# Patient Record
Sex: Female | Born: 1975 | Race: White | Hispanic: No | Marital: Married | Smoking: Former smoker
Health system: Southern US, Community
[De-identification: ages and names within clinical notes are randomized; demographics above are authoritative.]

## PROBLEM LIST (undated history)

## (undated) HISTORY — PX: AUGMENTATION MAMMAPLASTY: SUR837

## (undated) HISTORY — PX: WISDOM TOOTH EXTRACTION: SHX21

---

## 2006-11-05 HISTORY — PX: BREAST ENHANCEMENT SURGERY: SHX7

## 2012-12-23 ENCOUNTER — Ambulatory Visit: Payer: Self-pay | Admitting: Family Medicine

## 2012-12-23 VITALS — BP 112/62 | HR 74 | Temp 98.6°F | Resp 18 | Ht 65.0 in | Wt 124.8 lb

## 2012-12-23 DIAGNOSIS — R319 Hematuria, unspecified: Secondary | ICD-10-CM

## 2012-12-23 DIAGNOSIS — N390 Urinary tract infection, site not specified: Secondary | ICD-10-CM

## 2012-12-23 LAB — POCT UA - MICROSCOPIC ONLY
Bacteria, U Microscopic: NEGATIVE
Casts, Ur, LPF, POC: NEGATIVE
Crystals, Ur, HPF, POC: NEGATIVE
Mucus, UA: NEGATIVE
Yeast, UA: NEGATIVE

## 2012-12-23 LAB — POCT URINALYSIS DIPSTICK
Protein, UA: 100
Spec Grav, UA: 1.02
Urobilinogen, UA: 0.2
pH, UA: 7

## 2012-12-23 MED ORDER — PHENAZOPYRIDINE HCL 200 MG PO TABS: 200.0000 mg | ORAL_TABLET | Freq: Three times a day (TID) | ORAL | Status: DC | PRN

## 2012-12-23 MED ORDER — NITROFURANTOIN MONOHYD MACRO 100 MG PO CAPS: 100.0000 mg | ORAL_CAPSULE | Freq: Two times a day (BID) | ORAL | Status: DC

## 2012-12-23 NOTE — Progress Notes (Signed)
Subjective:    Patient ID: Amber Hodge, female    DOB: February 06, 1976, 37 y.o.   MRN: [id number] Chief Complaint  Patient presents with  . Urinary Tract Infection    pain when urinating, nausea, urge to urinate a lot   HPI  Blood in urine started this morning. Has a h/o UTIs since 37 yo - takes daily cranberry extract.  Hasn't had an infection in at last 3 yrs - has been able to control through cranberry.  Pain over bladder and pressure was severe this morning - almost brought her to tears.  Throughout the day it progressed to drops of blood on t.p. when wiping.  All sxs just started this morning - really escalated over an hr. No back pain.  No f/c, no vomiting but has been nauseated, no c/d.    Due to start period tomorrow.  Is sexually active using condoms.   History reviewed. No pertinent past medical history. No current outpatient prescriptions on file prior to visit.   No current facility-administered medications on file prior to visit.   Allergies  Allergen Reactions  . Codeine Itching and Swelling     Review of Systems  Constitutional: Negative for fever, chills, diaphoresis, activity change, appetite change, fatigue and unexpected weight change.  Gastrointestinal: Positive for nausea. Negative for vomiting, abdominal pain, diarrhea, constipation, blood in stool, anal bleeding and rectal pain.  Genitourinary: Positive for dysuria, urgency, frequency and pelvic pain. Negative for hematuria, flank pain, decreased urine volume, vaginal bleeding, vaginal discharge, enuresis, difficulty urinating, genital sores, vaginal pain, menstrual problem and dyspareunia.  Musculoskeletal: Negative for gait problem.  Skin: Negative for rash.  Hematological: Negative for adenopathy.  Psychiatric/Behavioral: The patient is not nervous/anxious.       BP 112/62  Pulse 74  Temp(Src) 98.6 F (37 C) (Oral)  Resp 18  Ht 5\' 5"  (1.651 m)  Wt 124 lb 12.8 oz (56.609 kg)  BMI 20.77 kg/m2  SpO2 100%   LMP 11/26/2012 Objective:   Physical Exam  Constitutional: She is oriented to person, place, and time. She appears well-developed and well-nourished. No distress.  HENT:  Head: Normocephalic and atraumatic.  Right Ear: External ear normal.  Left Ear: External ear normal.  Eyes: Conjunctivae are normal. No scleral icterus.  Neck: Normal range of motion. Neck supple. No thyromegaly present.  Cardiovascular: Normal rate, regular rhythm, normal heart sounds and intact distal pulses.   Pulmonary/Chest: Effort normal and breath sounds normal. No respiratory distress.  Abdominal: Soft. Bowel sounds are normal. She exhibits no distension and no mass. There is no hepatosplenomegaly. There is tenderness in the suprapubic area. There is no rigidity, no rebound, no guarding and no CVA tenderness.  Musculoskeletal: She exhibits no edema.  Lymphadenopathy:    She has no cervical adenopathy.  Neurological: She is alert and oriented to person, place, and time.  Skin: Skin is warm and dry. She is not diaphoretic. No erythema.  Psychiatric: She has a normal mood and affect. Her behavior is normal.          Results for orders placed in visit on 12/23/12  POCT UA - MICROSCOPIC ONLY      Result Value Range   WBC, Ur, HPF, POC TNTC     RBC, urine, microscopic TNTC     Bacteria, U Microscopic NEG     Mucus, UA NEG     Epithelial cells, urine per micros 3-4     Crystals, Ur, HPF, POC NEG  Casts, Ur, LPF, POC NEG     Yeast, UA NEG     Renal tubular cells POSITIVE    POCT URINALYSIS DIPSTICK      Result Value Range   Color, UA BROWN     Clarity, UA CLOUDY     Glucose, UA NEG     Bilirubin, UA NEG     Ketones, UA NEG     Spec Grav, UA 1.020     Blood, UA LARGE     pH, UA 7.0     Protein, UA 100     Urobilinogen, UA 0.2     Nitrite, UA POSITIVE     Leukocytes, UA large (3+)     Assessment & Plan:   Hematuria - Plan: POCT UA - Microscopic Only, POCT urinalysis dipstick, CANCELED: Urine  culture  UTI (urinary tract infection) - not sent for clx due to no insurance.  Meds ordered this encounter  Medications  . CRANBERRY EXTRACT PO    Sig: Take 100 mg by mouth.  . nitrofurantoin, macrocrystal-monohydrate, (MACROBID) 100 MG capsule    Sig: Take 1 capsule (100 mg total) by mouth 2 (two) times daily.    Dispense:  14 capsule    Refill:  0  . phenazopyridine (PYRIDIUM) 200 MG tablet    Sig: Take 1 tablet (200 mg total) by mouth 3 (three) times daily as needed for pain.    Dispense:  10 tablet    Refill:  0   Norberto Sorenson, MD MPH

## 2012-12-23 NOTE — Patient Instructions (Signed)
Urinary Tract Infection  Urinary tract infections (UTIs) can develop anywhere along your urinary tract. Your urinary tract is your body's drainage system for removing wastes and extra water. Your urinary tract includes two kidneys, two ureters, a bladder, and a urethra. Your kidneys are a pair of bean-shaped organs. Each kidney is about the size of your fist. They are located below your ribs, one on each side of your spine.  CAUSES  Infections are caused by microbes, which are microscopic organisms, including fungi, viruses, and bacteria. These organisms are so small that they can only be seen through a microscope. Bacteria are the microbes that most commonly cause UTIs.  SYMPTOMS   Symptoms of UTIs may vary by age and gender of the patient and by the location of the infection. Symptoms in young women typically include a frequent and intense urge to urinate and a painful, burning feeling in the bladder or urethra during urination. Older women and men are more likely to be tired, shaky, and weak and have muscle aches and abdominal pain. A fever may mean the infection is in your kidneys. Other symptoms of a kidney infection include pain in your back or sides below the ribs, nausea, and vomiting.  DIAGNOSIS  To diagnose a UTI, your caregiver will ask you about your symptoms. Your caregiver also will ask to provide a urine sample. The urine sample will be tested for bacteria and white blood cells. White blood cells are made by your body to help fight infection.  TREATMENT   Typically, UTIs can be treated with medication. Because most UTIs are caused by a bacterial infection, they usually can be treated with the use of antibiotics. The choice of antibiotic and length of treatment depend on your symptoms and the type of bacteria causing your infection.  HOME CARE INSTRUCTIONS   If you were prescribed antibiotics, take them exactly as your caregiver instructs you. Finish the medication even if you feel better after you  have only taken some of the medication.   Drink enough water and fluids to keep your urine clear or pale yellow.   Avoid caffeine, tea, and carbonated beverages. They tend to irritate your bladder.   Empty your bladder often. Avoid holding urine for long periods of time.   Empty your bladder before and after sexual intercourse.   After a bowel movement, women should cleanse from front to back. Use each tissue only once.  SEEK MEDICAL CARE IF:    You have back pain.   You develop a fever.   Your symptoms do not begin to resolve within 3 days.  SEEK IMMEDIATE MEDICAL CARE IF:    You have severe back pain or lower abdominal pain.   You develop chills.   You have nausea or vomiting.   You have continued burning or discomfort with urination.  MAKE SURE YOU:    Understand these instructions.   Will watch your condition.   Will get help right away if you are not doing well or get worse.  Document Released: 11/30/2004 Document Revised: 08/22/2011 Document Reviewed: 03/31/2011  [employer] Patient Information 2014 [employer].

## 2014-02-18 ENCOUNTER — Other Ambulatory Visit: Payer: Self-pay | Admitting: Obstetrics and Gynecology

## 2014-02-18 ENCOUNTER — Other Ambulatory Visit (HOSPITAL_COMMUNITY)
Admission: RE | Admit: 2014-02-18 | Discharge: 2014-02-18 | Disposition: A | Discharge status: Home / Self Care | Payer: 59 | Source: Ambulatory Visit | Attending: Obstetrics and Gynecology | Admitting: Obstetrics and Gynecology

## 2014-02-18 DIAGNOSIS — Z1151 Encounter for screening for human papillomavirus (HPV): Secondary | ICD-10-CM | POA: Insufficient documentation

## 2014-02-18 DIAGNOSIS — Z01419 Encounter for gynecological examination (general) (routine) without abnormal findings: Secondary | ICD-10-CM | POA: Diagnosis present

## 2014-02-19 LAB — CYTOLOGY - PAP

## 2015-04-22 ENCOUNTER — Other Ambulatory Visit (HOSPITAL_COMMUNITY)
Admission: RE | Admit: 2015-04-22 | Discharge: 2015-04-22 | Disposition: A | Discharge status: Home / Self Care | Payer: 59 | Source: Ambulatory Visit | Attending: Obstetrics and Gynecology | Admitting: Obstetrics and Gynecology

## 2015-04-22 ENCOUNTER — Other Ambulatory Visit: Payer: Self-pay | Admitting: Obstetrics and Gynecology

## 2015-04-22 DIAGNOSIS — Z01411 Encounter for gynecological examination (general) (routine) with abnormal findings: Secondary | ICD-10-CM | POA: Insufficient documentation

## 2015-04-22 DIAGNOSIS — Z1151 Encounter for screening for human papillomavirus (HPV): Secondary | ICD-10-CM | POA: Insufficient documentation

## 2015-04-23 LAB — CYTOLOGY - PAP

## 2015-05-07 ENCOUNTER — Other Ambulatory Visit: Payer: Self-pay

## 2015-05-07 DIAGNOSIS — Z1231 Encounter for screening mammogram for malignant neoplasm of breast: Secondary | ICD-10-CM

## 2015-05-07 DIAGNOSIS — Z9882 Breast implant status: Secondary | ICD-10-CM

## 2015-05-11 ENCOUNTER — Other Ambulatory Visit: Payer: Self-pay | Admitting: Obstetrics and Gynecology

## 2015-05-28 ENCOUNTER — Ambulatory Visit
Admission: RE | Admit: 2015-05-28 | Discharge: 2015-05-28 | Disposition: A | Discharge status: Home / Self Care | Payer: 59 | Source: Ambulatory Visit

## 2015-05-28 DIAGNOSIS — Z9882 Breast implant status: Secondary | ICD-10-CM

## 2015-05-28 DIAGNOSIS — Z1231 Encounter for screening mammogram for malignant neoplasm of breast: Secondary | ICD-10-CM

## 2016-01-10 ENCOUNTER — Ambulatory Visit (INDEPENDENT_AMBULATORY_CARE_PROVIDER_SITE_OTHER): Payer: 59 | Admitting: Physician Assistant

## 2016-01-10 VITALS — BP 90/70 | HR 75 | Temp 98.3°F | Resp 17 | Ht 65.0 in | Wt 130.0 lb

## 2016-01-10 DIAGNOSIS — J029 Acute pharyngitis, unspecified: Secondary | ICD-10-CM | POA: Diagnosis not present

## 2016-01-10 DIAGNOSIS — J0100 Acute maxillary sinusitis, unspecified: Secondary | ICD-10-CM | POA: Diagnosis not present

## 2016-01-10 LAB — POCT RAPID STREP A (OFFICE): RAPID STREP A SCREEN: NEGATIVE

## 2016-01-10 MED ORDER — GUAIFENESIN ER 1200 MG PO TB12: 1.0000 | ORAL_TABLET | Freq: Two times a day (BID) | ORAL | 1 refills | Status: DC | PRN

## 2016-01-10 MED ORDER — AMOXICILLIN-POT CLAVULANATE 875-125 MG PO TABS: 1.0000 | ORAL_TABLET | Freq: Two times a day (BID) | ORAL | 0 refills | Status: DC

## 2016-01-10 MED ORDER — AZELASTINE HCL 0.15 % NA SOLN: 2.0000 | Freq: Two times a day (BID) | NASAL | 0 refills | Status: DC

## 2016-01-10 NOTE — Progress Notes (Signed)
Subjective:    Patient ID: Amber Hodge, female    DOB: [birth year], 40 y.o.   MRN: [id number]  HPI: Presents with sore throat which began 6 days ago. Associated symptoms include nasal congestion and ear pain. States she feels like it "moved to my nasal passages yesterday" and that it has been worsening since then. Additional symptoms include congestion, lethargy, sinus pressure, mild cough without sputum, chills, headache, puffy eyes, decreased appetite, night sweats last night, rhinorrhea, and sneezing. 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 She denies fevers, N/V, abdominal pain, wheezing, SOB, eye discharge/itching/redness, ear discharge, tinnitus, chest pain or tightness. Has not received the flu vaccine. Has been using Ibuprofen (for headache), Mucinex DM, and neti pot at home which have provided some relief.  Review of Systems  Constitutional: Positive for appetite change, chills, diaphoresis and fatigue. Negative for fever and unexpected weight change.  HENT: Positive for congestion, postnasal drip, rhinorrhea, sinus pain, sinus pressure, sneezing and sore throat. Negative for ear discharge, ear pain and tinnitus.   Eyes: Negative for pain, discharge, redness and itching.  Respiratory: Positive for cough. Negative for choking, chest tightness, shortness of breath and wheezing.   Gastrointestinal: Negative for abdominal pain, blood in stool, constipation, diarrhea, nausea and vomiting.  Genitourinary: Negative for difficulty urinating, dysuria, frequency, hematuria and urgency.  Musculoskeletal: Negative for arthralgias and myalgias.  Skin: Negative for rash.  Neurological: Positive for headaches. Negative for dizziness, tremors, weakness and light-headedness.  Psychiatric/Behavioral: Negative for dysphoric mood.   Allergies  Allergen Reactions  . Codeine Itching and Swelling   Prior to Admission  medications   Medication Sig Start Date End Date Taking? Authorizing Provider  CRANBERRY EXTRACT PO Take 100 mg by mouth.   Yes Historical Provider, MD  Azelastine HCl 0.15 % SOLN Place 2 sprays into both nostrils 2 (two) times daily. 01/10/16   Chelle Jeffery, PA-C  Guaifenesin (MUCINEX MAXIMUM STRENGTH) 1200 MG TB12 Take 1 tablet (1,200 mg total) by mouth every 12 (twelve) hours as needed. 01/10/16   Chelle Jeffery, PA-C   There are no active problems to display for this patient.     Objective:   Physical Exam  Constitutional: She is oriented to person, place, and time. She appears well-developed and well-nourished.  HENT:  Head: Normocephalic and atraumatic. Head is without right periorbital erythema and without left periorbital erythema.  Right Ear: External ear normal. No drainage, swelling or tenderness. Tympanic membrane is not injected, not scarred, not perforated, not erythematous, not retracted and not bulging. No middle ear effusion.  Left Ear: External ear normal. No drainage, swelling or tenderness. Tympanic membrane is not injected, not scarred, not perforated, not erythematous, not retracted and not bulging.  No middle ear effusion.  Nose: Rhinorrhea present. No mucosal edema, sinus tenderness, septal deviation or nasal septal hematoma. No epistaxis. Right sinus exhibits maxillary sinus tenderness and frontal sinus tenderness. Left sinus exhibits maxillary sinus tenderness and frontal sinus tenderness.  Mouth/Throat: Uvula is midline. Mucous membranes are not pale, not dry and not cyanotic. No oral lesions. Normal dentition. Posterior oropharyngeal erythema present. No oropharyngeal exudate, posterior oropharyngeal edema or tonsillar abscesses.  Eyes: Conjunctivae are normal. Pupils are equal, round, and reactive to light. Right eye exhibits no discharge and no exudate. Left eye exhibits no discharge and no exudate. Right conjunctiva is not injected. Left conjunctiva is not injected.  No scleral icterus. Right pupil  is round and reactive. Left pupil is round and reactive. Pupils are equal.  Neck: Normal range of motion. Neck supple. No tracheal deviation present. No thyromegaly present.  Cardiovascular: Normal rate, regular rhythm, S1 normal, S2 normal, normal heart sounds and intact distal pulses.  Exam reveals no gallop and no friction rub.   No murmur heard. Pulmonary/Chest: Effort normal and breath sounds normal. No stridor. No respiratory distress. She has no decreased breath sounds. She has no wheezes. She has no rhonchi. She has no rales.  Lymphadenopathy:    She has no cervical adenopathy.  Neurological: She is alert and oriented to person, place, and time.  Skin: Skin is warm and dry. No rash noted. No erythema.  Psychiatric: She has a normal mood and affect. Her behavior is normal.     Results for orders placed or performed in visit on 01/10/16  POCT rapid strep A  Result Value Ref Range   Rapid Strep A Screen Negative Negative       Assessment & Plan:  1. Sore throat Negative rapid strep test, throat culture pending. Recommended rest, increased fluids, Azelastine nasal spray, and Mucinex DM for relief. Instructed patient to RTC if symptoms not improving or worsening. - Azelastine HCl 0.15 % SOLN; Place 2 sprays into both nostrils 2 (two) times daily.  Dispense: 30 mL; Refill: 0 - Guaifenesin (MUCINEX MAXIMUM STRENGTH) 1200 MG TB12; Take 1 tablet (1,200 mg total) by mouth every 12 (twelve) hours as needed.  Dispense: 14 tablet; Refill: 1 - POCT rapid strep A - Culture, Group A Strep

## 2016-01-10 NOTE — Progress Notes (Signed)
Patient ID: Amber Hodge, female     DOB: June 27, 1975, 40 y.o.    MRN: [id number]  PCP: No PCP Per Patient  Chief Complaint  Patient presents with  . Nasal Congestion    Onset 6 days  . Sore Throat  . Ear Pain    Subjective:    HPI  Presents for evaluation of 6 days of sore throat, associated with nasal congestion and ear pain, worsening since yesterday.  She relates fatigue, chills, headache, anorexia. 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 Ibuprofen, OTC guaifenesin and neti pot have helped some. No fever, nausea, vomiting, diarrhea, SOB, CP, dizziness. Has not yet received the seasonal flu vaccine.  Prior to Admission medications   Medication Sig Start Date End Date Taking? Authorizing Provider  CRANBERRY EXTRACT PO Take 100 mg by mouth.   Yes Historical Provider, MD     Allergies  Allergen Reactions  . Codeine Itching and Swelling     There are no active problems to display for this patient.    Family History  Problem Relation Age of Onset  . Hyperlipidemia Paternal Grandfather   . Diabetes Paternal Grandfather      Social History   Social History  . Marital status: Divorced    Spouse name: N/A  . Number of children: N/A  . Years of education: N/A   Occupational History  . Not on file.   Social History Main Topics  . Smoking status: Former Smoker    Types: Cigarettes    Quit date: 03/11/2000  . Smokeless tobacco: Never Used     Comment: Socially, while drinking   . Alcohol use 2.0 oz/week    4 Standard drinks or equivalent per week     Comment: Socially   . Drug use: No  . Sexual activity: Yes   Other Topics Concern  . Not on file   Social History Narrative   Works as a Interior and spatial designerhairdresser         Review of Systems Constitutional: Positive for appetite change, chills, diaphoresis and fatigue. Negative for fever and unexpected weight  change.  HENT: Positive for congestion, postnasal drip, rhinorrhea, sinus pain, sinus pressure, sneezing and sore throat. Negative for ear discharge, ear pain and tinnitus.   Eyes: Negative for pain, discharge, redness and itching.  Respiratory: Positive for cough. Negative for choking, chest tightness, shortness of breath and wheezing.   Gastrointestinal: Negative for abdominal pain, blood in stool, constipation, diarrhea, nausea and vomiting.  Genitourinary: Negative for difficulty urinating, dysuria, frequency, hematuria and urgency.  Musculoskeletal: Negative for arthralgias and myalgias.  Skin: Negative for rash.  Neurological: Positive for headaches. Negative for dizziness, tremors, weakness and light-headedness.  Psychiatric/Behavioral: Negative for dysphoric mood.       Objective:  Physical Exam  Constitutional: She is oriented to person, place, and time. She appears well-developed and well-nourished. No distress.  BP 90/70 (BP Location: Right Arm, Patient Position: Sitting, Cuff Size: Normal)   Pulse 75   Temp 98.3 F (36.8 C) (Oral)   Resp 17   Ht 5\' 5"  (1.651 m)   Wt 130 lb (59 kg)   LMP 01/07/2016   SpO2 99%   BMI 21.63 kg/m    HENT:  Head: Normocephalic and atraumatic.  Right Ear: Hearing, tympanic membrane, external ear and ear canal normal.  Left Ear: Hearing, tympanic membrane, external ear and ear canal normal.  Nose: Mucosal edema and rhinorrhea present.  No foreign bodies. Right sinus exhibits maxillary sinus tenderness and frontal sinus tenderness. Left sinus exhibits maxillary sinus tenderness and frontal sinus tenderness.  Mouth/Throat: Uvula is midline and mucous membranes are normal. No uvula swelling. Posterior oropharyngeal erythema (minimal) present. No oropharyngeal exudate, posterior oropharyngeal edema or tonsillar abscesses.  Eyes: Conjunctivae and EOM are normal. Pupils are equal, round, and reactive to light. Right eye exhibits no discharge. Left eye  exhibits no discharge. No scleral icterus.  Neck: Trachea normal, normal range of motion and full passive range of motion without pain. Neck supple. No thyroid mass and no thyromegaly present.  Cardiovascular: Normal rate, regular rhythm and normal heart sounds.   Pulmonary/Chest: Effort normal and breath sounds normal.  Lymphadenopathy:       Head (right side): No submandibular, no tonsillar, no preauricular, no posterior auricular and no occipital adenopathy present.       Head (left side): No submandibular, no tonsillar, no preauricular and no occipital adenopathy present.    She has no cervical adenopathy.       Right: No supraclavicular adenopathy present.       Left: No supraclavicular adenopathy present.  Neurological: She is alert and oriented to person, place, and time. She has normal strength. No cranial nerve deficit or sensory deficit.  Skin: Skin is warm, dry and intact. No rash noted.  Psychiatric: She has a normal mood and affect. Her speech is normal and behavior is normal.    Results for orders placed or performed in visit on 01/10/16  POCT rapid strep A  Result Value Ref Range   Rapid Strep A Screen Negative Negative            Assessment & Plan:  1. Sore throat Suspect this is due to drainage from sinusitis. Await TCx. Sinusitis treatment with Augmentin, which covers strep, should the Cx be positive. - Azelastine HCl 0.15 % SOLN; Place 2 sprays into both nostrils 2 (two) times daily.  Dispense: 30 mL; Refill: 0 - Guaifenesin (MUCINEX MAXIMUM STRENGTH) 1200 MG TB12; Take 1 tablet (1,200 mg total) by mouth every 12 (twelve) hours as needed.  Dispense: 14 tablet; Refill: 1 - POCT rapid strep A - Culture, Group A Strep  2. Acute non-recurrent maxillary sinusitis Supportive care as above. Cover for bacterial cause.  - amoxicillin-clavulanate (AUGMENTIN) 875-125 MG tablet; Take 1 tablet by mouth 2 (two) times daily.  Dispense: 20 tablet; Refill: 0   Fernande Brashelle S.  Jezabelle Chisolm, PA-C Physician Assistant-Certified [facility]

## 2016-01-10 NOTE — Patient Instructions (Addendum)
     IF you received an x-ray today, you will receive an invoice from [facility]. Please contact [facility] at [phone] with questions or concerns regarding your invoice.   IF you received labwork today, you will receive an invoice from [facility]. Please contact [facility] at [phone] with questions or concerns regarding your invoice.   Our billing staff will not be able to assist you with questions regarding bills from these companies.  You will be contacted with the lab results as soon as they are available. The fastest way to get your results is to activate your My Chart account. Instructions are located on the last page of this paperwork. If you have not heard from us regarding the results in 2 weeks, please contact this office.      

## 2016-01-11 ENCOUNTER — Telehealth: Payer: Self-pay

## 2016-01-11 NOTE — Telephone Encounter (Signed)
Pt was seen here by Chelle on 01/10/16 for Sore throat +1 more  Pt would like Chelle to know she has red bumps in the back of her throat and white bumps on her tongue. CB # [phone]

## 2016-01-12 LAB — CULTURE, GROUP A STREP: Organism ID, Bacteria: NORMAL

## 2016-01-12 NOTE — Telephone Encounter (Signed)
It's difficult to know what this represents without seeing her, but often we see red spots on the soft palate and throat with viral illnesses. If these persist, or if she isn't much improved after 48 hours on the Augmentin, I recommend re-evaluation.

## 2016-01-15 NOTE — Telephone Encounter (Signed)
I have called patient to advise. She states she is fine now, feels better.

## 2016-04-24 ENCOUNTER — Other Ambulatory Visit: Payer: Self-pay | Admitting: Obstetrics and Gynecology

## 2016-04-24 DIAGNOSIS — Z1231 Encounter for screening mammogram for malignant neoplasm of breast: Secondary | ICD-10-CM

## 2016-05-12 ENCOUNTER — Other Ambulatory Visit (HOSPITAL_COMMUNITY)
Admission: RE | Admit: 2016-05-12 | Discharge: 2016-05-12 | Disposition: A | Discharge status: Home / Self Care | Payer: 59 | Source: Ambulatory Visit | Attending: Obstetrics and Gynecology | Admitting: Obstetrics and Gynecology

## 2016-05-12 ENCOUNTER — Other Ambulatory Visit: Payer: Self-pay | Admitting: Obstetrics and Gynecology

## 2016-05-12 DIAGNOSIS — Z1151 Encounter for screening for human papillomavirus (HPV): Secondary | ICD-10-CM | POA: Insufficient documentation

## 2016-05-12 DIAGNOSIS — Z01419 Encounter for gynecological examination (general) (routine) without abnormal findings: Secondary | ICD-10-CM | POA: Insufficient documentation

## 2016-05-16 LAB — CYTOLOGY - PAP
Diagnosis: NEGATIVE
HPV (WINDOPATH): NOT DETECTED

## 2016-06-02 ENCOUNTER — Ambulatory Visit
Admission: RE | Admit: 2016-06-02 | Discharge: 2016-06-02 | Disposition: A | Discharge status: Home / Self Care | Payer: 59 | Source: Ambulatory Visit | Attending: Obstetrics and Gynecology | Admitting: Obstetrics and Gynecology

## 2016-06-02 DIAGNOSIS — Z1231 Encounter for screening mammogram for malignant neoplasm of breast: Secondary | ICD-10-CM

## 2017-05-08 ENCOUNTER — Other Ambulatory Visit: Payer: Self-pay | Admitting: Obstetrics and Gynecology

## 2017-05-08 DIAGNOSIS — Z1231 Encounter for screening mammogram for malignant neoplasm of breast: Secondary | ICD-10-CM

## 2017-05-17 ENCOUNTER — Ambulatory Visit
Admission: RE | Admit: 2017-05-17 | Discharge: 2017-05-17 | Disposition: A | Discharge status: Home / Self Care | Source: Ambulatory Visit | Attending: Family Medicine | Admitting: Family Medicine

## 2017-05-17 ENCOUNTER — Other Ambulatory Visit: Payer: Self-pay | Admitting: Family Medicine

## 2017-05-17 DIAGNOSIS — M25562 Pain in left knee: Secondary | ICD-10-CM

## 2017-05-25 ENCOUNTER — Other Ambulatory Visit: Payer: Self-pay | Admitting: Obstetrics and Gynecology

## 2017-05-25 ENCOUNTER — Other Ambulatory Visit (HOSPITAL_COMMUNITY)
Admission: RE | Admit: 2017-05-25 | Discharge: 2017-05-25 | Disposition: A | Discharge status: Home / Self Care | Source: Ambulatory Visit | Attending: Obstetrics and Gynecology | Admitting: Obstetrics and Gynecology

## 2017-05-25 DIAGNOSIS — Z124 Encounter for screening for malignant neoplasm of cervix: Secondary | ICD-10-CM | POA: Diagnosis not present

## 2017-05-30 LAB — CYTOLOGY - PAP
DIAGNOSIS: NEGATIVE
HPV (WINDOPATH): NOT DETECTED

## 2017-06-08 ENCOUNTER — Ambulatory Visit
Admission: RE | Admit: 2017-06-08 | Discharge: 2017-06-08 | Disposition: A | Discharge status: Home / Self Care | Source: Ambulatory Visit | Attending: Obstetrics and Gynecology | Admitting: Obstetrics and Gynecology

## 2017-06-08 DIAGNOSIS — Z1231 Encounter for screening mammogram for malignant neoplasm of breast: Secondary | ICD-10-CM

## 2017-10-01 ENCOUNTER — Emergency Department (HOSPITAL_COMMUNITY)

## 2017-10-01 ENCOUNTER — Other Ambulatory Visit: Payer: Self-pay

## 2017-10-01 ENCOUNTER — Emergency Department (HOSPITAL_COMMUNITY)
Admission: EM | Admit: 2017-10-01 | Discharge: 2017-10-01 | Disposition: A | Discharge status: Home / Self Care | Attending: Emergency Medicine | Admitting: Emergency Medicine

## 2017-10-01 ENCOUNTER — Encounter (HOSPITAL_COMMUNITY): Payer: Self-pay | Admitting: Emergency Medicine

## 2017-10-01 DIAGNOSIS — R202 Paresthesia of skin: Secondary | ICD-10-CM | POA: Insufficient documentation

## 2017-10-01 DIAGNOSIS — H538 Other visual disturbances: Secondary | ICD-10-CM | POA: Diagnosis not present

## 2017-10-01 DIAGNOSIS — R200 Anesthesia of skin: Secondary | ICD-10-CM | POA: Diagnosis present

## 2017-10-01 DIAGNOSIS — R29818 Other symptoms and signs involving the nervous system: Secondary | ICD-10-CM | POA: Insufficient documentation

## 2017-10-01 DIAGNOSIS — I7300 Raynaud's syndrome without gangrene: Secondary | ICD-10-CM | POA: Diagnosis not present

## 2017-10-01 DIAGNOSIS — Z87891 Personal history of nicotine dependence: Secondary | ICD-10-CM | POA: Insufficient documentation

## 2017-10-01 DIAGNOSIS — Z79899 Other long term (current) drug therapy: Secondary | ICD-10-CM | POA: Diagnosis not present

## 2017-10-01 LAB — DIFFERENTIAL
Abs Immature Granulocytes: 0 10*3/uL (ref 0.0–0.1)
BASOS ABS: 0 10*3/uL (ref 0.0–0.1)
Basophils Relative: 1 %
Eosinophils Absolute: 0 10*3/uL (ref 0.0–0.7)
Eosinophils Relative: 1 %
IMMATURE GRANULOCYTES: 0 %
Lymphocytes Relative: 46 %
Lymphs Abs: 2 10*3/uL (ref 0.7–4.0)
Monocytes Absolute: 0.4 10*3/uL (ref 0.1–1.0)
Monocytes Relative: 10 %
Neutro Abs: 1.8 10*3/uL (ref 1.7–7.7)
Neutrophils Relative %: 42 %

## 2017-10-01 LAB — I-STAT CHEM 8, ED
BUN: 15 mg/dL (ref 6–20)
Calcium, Ion: 1.28 mmol/L (ref 1.15–1.40)
Chloride: 102 mmol/L (ref 98–111)
Creatinine, Ser: 0.7 mg/dL (ref 0.44–1.00)
GLUCOSE: 98 mg/dL (ref 70–99)
HEMATOCRIT: 43 % (ref 36.0–46.0)
HEMOGLOBIN: 14.6 g/dL (ref 12.0–15.0)
Potassium: 4.6 mmol/L (ref 3.5–5.1)
Sodium: 138 mmol/L (ref 135–145)
TCO2: 25 mmol/L (ref 22–32)

## 2017-10-01 LAB — COMPREHENSIVE METABOLIC PANEL
ALT: 29 U/L (ref 0–44)
AST: 25 U/L (ref 15–41)
Albumin: 4.3 g/dL (ref 3.5–5.0)
Alkaline Phosphatase: 59 U/L (ref 38–126)
Anion gap: 5 (ref 5–15)
BUN: 14 mg/dL (ref 6–20)
CO2: 28 mmol/L (ref 22–32)
Calcium: 9.7 mg/dL (ref 8.9–10.3)
Chloride: 104 mmol/L (ref 98–111)
Creatinine, Ser: 0.77 mg/dL (ref 0.44–1.00)
GFR calc non Af Amer: >60 mL/min (ref >60)
Glucose, Bld: 103 mg/dL — ABNORMAL HIGH (ref 70–99)
POTASSIUM: 4.6 mmol/L (ref 3.5–5.1)
Sodium: 137 mmol/L (ref 135–145)
Total Bilirubin: 0.7 mg/dL (ref 0.3–1.2)
Total Protein: 7.4 g/dL (ref 6.5–8.1)

## 2017-10-01 LAB — CBC
HEMATOCRIT: 43.1 % (ref 36.0–46.0)
HEMOGLOBIN: 13.7 g/dL (ref 12.0–15.0)
MCH: 29 pg (ref 26.0–34.0)
MCHC: 31.8 g/dL (ref 30.0–36.0)
MCV: 91.3 fL (ref 78.0–100.0)
Platelets: 252 10*3/uL (ref 150–400)
RBC: 4.72 MIL/uL (ref 3.87–5.11)
RDW: 12.3 % (ref 11.5–15.5)
WBC: 4.3 10*3/uL (ref 4.0–10.5)

## 2017-10-01 LAB — I-STAT TROPONIN, ED: TROPONIN I, POC: 0 ng/mL (ref 0.00–0.08)

## 2017-10-01 LAB — PROTIME-INR
INR: 1.05
Prothrombin Time: 13.6 seconds (ref 11.4–15.2)

## 2017-10-01 LAB — APTT: APTT: 25 s (ref 24–36)

## 2017-10-01 LAB — I-STAT BETA HCG BLOOD, ED (MC, WL, AP ONLY)

## 2017-10-01 MED ORDER — GADOBENATE DIMEGLUMINE 529 MG/ML IV SOLN
15.0000 mL | Freq: Once | INTRAVENOUS | Status: AC
  Administered 2017-10-01: 12 mL via INTRAVENOUS

## 2017-10-01 MED ORDER — NAPROXEN 375 MG PO TABS: 375.0000 mg | ORAL_TABLET | Freq: Two times a day (BID) | ORAL | 0 refills | Status: AC

## 2017-10-01 NOTE — ED Triage Notes (Signed)
Pt. 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 I have never lost my vision like that.  No other deficits per pt..Marland Kitchen

## 2017-10-01 NOTE — ED Provider Notes (Signed)
[facility] EMERGENCY DEPARTMENT Provider Note   CSN: [id number] Arrival date & time: 10/01/17  4098     History   Chief Complaint Chief Complaint  Patient presents with  . Numbness  . Loss of Vision    HPI Amber Hodge is a 42 y.o. female.  HPI   42 year old female with history of Raynaud's here with right arm numbness and loss of vision.  The patient states that while at work today, she began to notice tingling and numbness along the lateral aspect of her right arm.  She then acutely lost vision.  She describes it as significant blurring of her vision.  No double vision or blacking out of her vision.  Symptoms lasted at least several minutes.  She has a history of recurrent intermittent paresthesias and numbness of bilateral upper greater than lower extremities, and has not been worked up for these.  She also endorses general fatigue.  She was diagnosed with ray nods, and does note that her hands do occasionally get white.  She has had intermittent headaches.  Denies any overt eye pain.  No difficulty speaking or swallowing.  No fevers or chills.  No headache currently.  No neck pain or neck stiffness.  She works as a Interior and spatial designer.  Symptoms seem to come randomly, and are not particularly worse at any time during the day.  No known personal or family history of multiple sclerosis or other neurological conditions.  History reviewed. No pertinent past medical history.  There are no active problems to display for this patient.   Past Surgical History:  Procedure Laterality Date  . AUGMENTATION MAMMAPLASTY Bilateral   . BREAST ENHANCEMENT SURGERY Bilateral 11/2006  . WISDOM TOOTH EXTRACTION Bilateral 1999 or 2000     OB History   None      Home Medications    Prior to Admission medications   Medication Sig Start Date End Date Taking? Authorizing Provider  aspirin-acetaminophen-caffeine (EXCEDRIN MIGRAINE) [phone] MG tablet Take 1 tablet by mouth every  6 (six) hours as needed for headache.   Yes [provider]  b complex vitamins capsule Take 1 capsule by mouth daily.   Yes [provider]  cetirizine (ZYRTEC) 10 MG tablet Take 10 mg by mouth daily.   Yes [provider]  CRANBERRY EXTRACT PO Take 100 mg by mouth.   Yes [provider]  fluticasone (FLONASE) 50 MCG/ACT nasal spray Place 1 spray into both nostrils daily.   Yes [provider]  ibuprofen (ADVIL,MOTRIN) 200 MG tablet Take 600 mg by mouth every 6 (six) hours as needed for cramping.   Yes [provider]  Lactobacillus (PROBIOTIC ACIDOPHILUS PO) Take 1 capsule by mouth daily.   Yes [provider]  magnesium gluconate (MAGONATE) 500 MG tablet Take 500 mg by mouth daily.   Yes [provider]  Omega-3 Fatty Acids (EQL OMEGA 3 FISH OIL PO) Take 200 mg by mouth 2 (two) times daily.   Yes [provider]  amoxicillin-clavulanate (AUGMENTIN) 875-125 MG tablet Take 1 tablet by mouth 2 (two) times daily. Patient not taking: Reported on 10/01/2017 01/10/16   Porfirio Oar, PA-C  Azelastine HCl 0.15 % SOLN Place 2 sprays into both nostrils 2 (two) times daily. Patient not taking: Reported on 10/01/2017 01/10/16   Porfirio Oar, PA-C  Guaifenesin [facility] MAXIMUM STRENGTH) 1200 MG TB12 Take 1 tablet (1,200 mg total) by mouth every 12 (twelve) hours as needed. Patient not taking: Reported on 10/01/2017 01/10/16  Jeffery, Chelle, PA-C  naproxen (NAPROSYN) 375 MG tablet Take 1 tablet (375 mg total) by mouth 2 (two) times daily with a meal for 7 days. 10/01/17 10/08/17  Shaune Pollack, MD    Family History Family History  Problem Relation Age of Onset  . Hyperlipidemia Paternal Grandfather   . Diabetes Paternal Grandfather     Social History Social History   Tobacco Use  . Smoking status: Former Smoker    Types: Cigarettes    Last attempt to quit: 03/11/2000    Years since quitting: 17.5  . Smokeless  tobacco: Never Used  . Tobacco comment: Socially, while drinking   Substance Use Topics  . Alcohol use: Yes    Alcohol/week: 2.4 oz    Types: 4 Standard drinks or equivalent per week    Comment: Socially   . Drug use: No     Allergies   Codeine   Review of Systems Review of Systems  Constitutional: Positive for fatigue. Negative for chills and fever.  HENT: Negative for congestion and rhinorrhea.   Eyes: Positive for visual disturbance.  Respiratory: Negative for cough, shortness of breath and wheezing.   Cardiovascular: Negative for chest pain and leg swelling.  Gastrointestinal: Negative for abdominal pain, diarrhea, nausea and vomiting.  Genitourinary: Negative for dysuria and flank pain.  Musculoskeletal: Negative for neck pain and neck stiffness.  Skin: Negative for rash and wound.  Allergic/Immunologic: Negative for immunocompromised state.  Neurological: Positive for numbness. Negative for syncope, weakness and headaches.  All other systems reviewed and are negative.    Physical Exam Updated Vital Signs BP 104/70   Pulse 74   Temp 98.7 F (37.1 C) (Oral)   Resp 16   Ht 5' 5.5" (1.664 m)   Wt 59 kg (130 lb)   LMP 09/17/2017   SpO2 100%   BMI 21.30 kg/m   Physical Exam  Constitutional: She is oriented to person, place, and time. She appears well-developed and well-nourished. No distress.  HENT:  Head: Normocephalic and atraumatic.  Mouth/Throat: Oropharynx is clear and moist.  Eyes: Conjunctivae are normal.  Neck: Neck supple.  Cardiovascular: Normal rate, regular rhythm and normal heart sounds. Exam reveals no friction rub.  No murmur heard. Pulmonary/Chest: Effort normal and breath sounds normal. No respiratory distress. She has no wheezes. She has no rales.  Abdominal: She exhibits no distension.  Musculoskeletal: She exhibits no edema.  Neurological: She is alert and oriented to person, place, and time. She exhibits normal muscle tone.  Skin: Skin  is warm. Capillary refill takes less than 2 seconds.  Psychiatric: She has a normal mood and affect.  Nursing note and vitals reviewed.   Neurological Exam:  Mental Status: Alert and oriented to person, place, and time. Attention and concentration normal. Speech clear. Recent memory is intact. Cranial Nerves: Visual fields grossly intact. EOMI and PERRLA. No nystagmus noted. Facial sensation intact at forehead, maxillary cheek, and chin/mandible bilaterally. No facial asymmetry or weakness. Hearing grossly normal. Uvula is midline, and palate elevates symmetrically. Normal SCM and trapezius strength. Tongue midline without fasciculations. Motor: Muscle strength 5/5 in proximal and distal UE and LE bilaterally. No pronator drift. Muscle tone normal. Reflexes: 2+ and symmetrical in all four extremities.  Sensation: Intact to light touch in upper and lower extremities distally bilaterally.  Gait: Normal without ataxia. Coordination: Normal FTN bilaterally.      ED Treatments / Results  Labs (all labs ordered are listed, but only abnormal results are displayed) Labs Reviewed  COMPREHENSIVE METABOLIC PANEL - Abnormal; Notable for the following components:      Result Value   Glucose, Bld 103 (*)    All other components within normal limits  PROTIME-INR  APTT  CBC  DIFFERENTIAL  I-STAT TROPONIN, ED  I-STAT CHEM 8, ED  I-STAT BETA HCG BLOOD, ED ([facility], [facility], [facility] ONLY)  CBG MONITORING, ED    EKG EKG Interpretation  Date/Time:  Monday October 01 2017 09:36:16 EDT Ventricular Rate:  71 PR Interval:  138 QRS Duration: 76 QT Interval:  374 QTC Calculation: 406 R Axis:   85 Text Interpretation:  Normal sinus rhythm with sinus arrhythmia No old tracing to compare Confirmed by Shaune PollackIsaacs, Julien Oscar [phone]([id number]) on 10/01/2017 11:32:14 AM   Radiology Mr Brain W And Wo Contrast  Result Date: 10/01/2017 CLINICAL DATA:  Focal neuro deficit. Vision loss of right arm numbness. EXAM: MRI HEAD WITHOUT AND  WITH CONTRAST MRI CERVICAL SPINE WITHOUT AND WITH CONTRAST TECHNIQUE: Multiplanar, multiecho pulse sequences of the brain and surrounding structures, and cervical spine, to include the craniocervical junction and cervicothoracic junction, were obtained without and with intravenous contrast. CONTRAST:  12mL MULTIHANCE GADOBENATE DIMEGLUMINE 529 MG/ML IV SOLN COMPARISON:  None. FINDINGS: MRI HEAD FINDINGS Brain: No acute infarction, hemorrhage, hydrocephalus, extra-axial collection or mass lesion. No evidence of demyelinating disease. Normal enhancement postcontrast administration. Vascular: Normal arterial flow voids Skull and upper cervical spine: Negative Sinuses/Orbits: Mild mucosal edema left ethmoid and maxillary sinus. Normal orbit Other: None MRI CERVICAL SPINE FINDINGS Alignment: Normal alignment.  Mild cervical kyphosis Vertebrae: Negative for fracture or mass. Cord: Normal signal and morphology. Posterior Fossa, vertebral arteries, paraspinal tissues: Negative Disc levels: Small central disc protrusion at C4-5 without stenosis. No other significant disc degeneration in the cervical spine. Normal enhancement of the cervical spine. IMPRESSION: 1. Normal MRI brain with contrast 2. Mild disc degeneration at C5-6 without spinal stenosis. Normal spinal cord. Electronically Signed   By: Marlan Palauharles  Clark M.D.   On: 10/01/2017 13:46   Mr Cervical Spine W Or Wo Contrast  Result Date: 10/01/2017 CLINICAL DATA:  Focal neuro deficit. Vision loss of right arm numbness. EXAM: MRI HEAD WITHOUT AND WITH CONTRAST MRI CERVICAL SPINE WITHOUT AND WITH CONTRAST TECHNIQUE: Multiplanar, multiecho pulse sequences of the brain and surrounding structures, and cervical spine, to include the craniocervical junction and cervicothoracic junction, were obtained without and with intravenous contrast. CONTRAST:  12mL MULTIHANCE GADOBENATE DIMEGLUMINE 529 MG/ML IV SOLN COMPARISON:  None. FINDINGS: MRI HEAD FINDINGS Brain: No acute  infarction, hemorrhage, hydrocephalus, extra-axial collection or mass lesion. No evidence of demyelinating disease. Normal enhancement postcontrast administration. Vascular: Normal arterial flow voids Skull and upper cervical spine: Negative Sinuses/Orbits: Mild mucosal edema left ethmoid and maxillary sinus. Normal orbit Other: None MRI CERVICAL SPINE FINDINGS Alignment: Normal alignment.  Mild cervical kyphosis Vertebrae: Negative for fracture or mass. Cord: Normal signal and morphology. Posterior Fossa, vertebral arteries, paraspinal tissues: Negative Disc levels: Small central disc protrusion at C4-5 without stenosis. No other significant disc degeneration in the cervical spine. Normal enhancement of the cervical spine. IMPRESSION: 1. Normal MRI brain with contrast 2. Mild disc degeneration at C5-6 without spinal stenosis. Normal spinal cord. Electronically Signed   By: Marlan Palauharles  Clark M.D.   On: 10/01/2017 13:46    Procedures Procedures (including critical care time)  Medications Ordered in ED Medications  gadobenate dimeglumine (MULTIHANCE) injection 15 mL (12 mLs Intravenous Contrast Given 10/01/17 1345)     Initial Impression / Assessment and Plan / ED Course  I have reviewed the triage vital signs and the nursing notes.  Pertinent labs & imaging results that were available during my care of the patient were reviewed by me and considered in my medical decision making (see chart for details).     42 year old female with past medical history as above here with intermittent upper extremity paresthesias now transient blurred vision.  Patient is now back to baseline.  She has no focal neurological deficits on my exam.  Given concern for possible underlying multiple sclerosis or other demyelinating process, CT scan obtained and is negative.  Patient does have a history of ray nods and she could have a component of autoimmune condition or underlying vasospasm contributing to her intermittent  neurological symptoms.  At this time, however, there is no evidence of emergent pathology.  I do think she needs neurology follow-up.  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  Final Clinical Impressions(s) / ED Diagnoses   Final diagnoses:  Paresthesia  Transient neurologic deficit    ED Discharge Orders        Ordered    naproxen (NAPROSYN) 375 MG tablet  2 times daily with meals     10/01/17 1353       Shaune Pollack, MD 10/01/17 1417

## 2017-10-01 NOTE — ED Notes (Signed)
D/C with patient and spouse

## 2017-10-01 NOTE — Discharge Instructions (Addendum)
As we discussed, some of your symptoms may be related to an underlying inflammatory or autoimmune condition, particularly given your history.  I think it is important to follow-up with a neurologist for further evaluation, and possibly nerve conduction studies.  For now, I have prescribed a course of anti-inflammatories.  Take these with meals.

## 2017-10-05 ENCOUNTER — Ambulatory Visit: Admitting: Neurology

## 2017-10-17 ENCOUNTER — Ambulatory Visit (INDEPENDENT_AMBULATORY_CARE_PROVIDER_SITE_OTHER): Admitting: Neurology

## 2017-10-17 ENCOUNTER — Encounter: Payer: Self-pay | Admitting: Neurology

## 2017-10-17 VITALS — BP 92/56 | HR 72 | Ht 65.5 in | Wt 131.5 lb

## 2017-10-17 DIAGNOSIS — R202 Paresthesia of skin: Secondary | ICD-10-CM | POA: Diagnosis not present

## 2017-10-17 DIAGNOSIS — M791 Myalgia, unspecified site: Secondary | ICD-10-CM

## 2017-10-17 NOTE — Progress Notes (Signed)
PATIENT: Amber Hodge DOB: [birth year]  Chief Complaint  Patient presents with  . Numbness/vision disturbance    She developed right arm numbness and blurred vision on 10/01/17.  She was evaluated in the ED and underwent MRI scans (cervical, brain).  Her vision restored within seconds but it took several hours for the arm numbness to resolve.  She also has concerns of having Raynaud's Syndrome but has never been formally evaluated.  [facility]    Thurnell Lose, MD (takes care of primary needs).  Referred from ED,     HISTORICAL  Amber Hodge is a 42 years old female, seen in request by her gynecologist Dr. Thurnell Lose for evaluation of right arm numbness and blurry vision, initial evaluation was on October 17, 2017.  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  The primary care's office directed her to the emergency room, right hand paresthesia last about few hours, gradually improved.  I reviewed emergency room evaluation,  MRI of the brain and cervical spine on October 01, 2017 showed no significant abnormality  Laboratory evaluation showed normal CMP, CBC, INR was 1.05  In addition, she complained of couple years history of slow worsening generalized fatigue, lack of stamina.  REVIEW OF SYSTEMS: Full 14 system review of systems performed and notable only for fatigue, blurred vision, easy bruising, feeling hot, joint pain, aching muscles, weakness, numbness,  dizziness  ALLERGIES: Allergies  Allergen Reactions  . Codeine Itching and Swelling    HOME MEDICATIONS: Current Outpatient Medications  Medication Sig Dispense Refill  . aspirin-acetaminophen-caffeine (EXCEDRIN MIGRAINE) 250-250-65 MG tablet Take 1 tablet by mouth every 6 (six) hours as needed for headache.    . b complex vitamins capsule Take 1 capsule by mouth daily.    . cetirizine (ZYRTEC) 10 MG tablet Take 10 mg by mouth daily.    Marland Kitchen CRANBERRY EXTRACT PO Take 100 mg by mouth.    . fluticasone (FLONASE) 50 MCG/ACT nasal spray Place 1 spray into both nostrils daily.    [facility] Kitchen ibuprofen (ADVIL,MOTRIN) 200 MG tablet Take 600 mg by mouth every 6 (six) hours as needed for cramping.    . Lactobacillus (PROBIOTIC ACIDOPHILUS PO) Take 1 capsule by mouth daily.    . magnesium gluconate (MAGONATE) 500 MG tablet Take 500 mg by mouth daily.    . Omega-3 Fatty Acids (EQL OMEGA 3 FISH OIL PO) Take 200 mg by mouth 2 (two) times daily.     No current facility-administered medications for this visit.     PAST MEDICAL HISTORY: History reviewed. No pertinent past medical history.  PAST SURGICAL HISTORY: Past Surgical History:  Procedure Laterality Date  . AUGMENTATION MAMMAPLASTY Bilateral   . BREAST ENHANCEMENT SURGERY Bilateral 11/2006  . WISDOM TOOTH EXTRACTION Bilateral 1999 or 2000    FAMILY HISTORY: Family History  Problem Relation Age of Onset  . Heart murmur Mother   . Healthy Father   . Heart attack Maternal Grandfather   . Hyperlipidemia Paternal  Grandfather   . Diabetes Paternal Grandfather     SOCIAL HISTORY: Social History   Socioeconomic History  . Marital status: Divorced    Spouse name: Not on file  . Number of children: 2  . Years of education: trade school  . Highest education level: Not on file  Occupational History  . Occupation: hairdresser  Social Needs  . Financial resource strain: Not on file  . Food insecurity:    Worry: Not on file    Inability: Not  on file  . Transportation needs:    Medical: Not on file    Non-medical: Not on file  Tobacco Use  . Smoking status: Former Smoker    Types: Cigarettes    Last attempt to quit: 03/11/2000    Years since quitting: 17.6  . Smokeless tobacco: Never Used  . Tobacco comment: Socially, while drinking   Substance and Sexual Activity  . Alcohol use: Yes    Alcohol/week: 4.0 standard drinks    Types: 4 Standard drinks or equivalent per week    Comment: Socially   . Drug use: No  . Sexual activity: Yes  Lifestyle  . Physical activity:    Days per week: Not on file    Minutes per session: Not on file  . Stress: Not on file  Relationships  . Social connections:    Talks on phone: Not on file    Gets together: Not on file    Attends religious service: Not on file    Active member of club or organization: Not on file    Attends meetings of clubs or organizations: Not on file    Relationship status: Not on file  . Intimate partner violence:    Fear of current or ex partner: Not on file    Emotionally abused: Not on file    Physically abused: Not on file    Forced sexual activity: Not on file  Other Topics Concern  . Not on file  Social History Narrative   Lives at home with husband and children.   Right-handed.   1 cup coffee per day.     PHYSICAL EXAM   Vitals:   10/17/17 0944  BP: (!) 92/56  Pulse: 72  Weight: 131 lb 8 oz (59.6 kg)  Height: 5' 5.5" (1.664 m)    Not recorded      Body mass index is 21.55 kg/m.  PHYSICAL EXAMNIATION:  Gen: NAD, conversant, well nourised, obese, well groomed                     Cardiovascular: Regular rate rhythm, no peripheral edema, warm, nontender. Eyes: Conjunctivae clear without exudates or hemorrhage Neck: Supple, no carotid bruits. Pulmonary: Clear to auscultation bilaterally   NEUROLOGICAL EXAM:  MENTAL STATUS: Speech:    Speech is normal; fluent and spontaneous with normal comprehension.  Cognition:     Orientation to  time, place and person     Normal recent and remote memory     Normal Attention span and concentration     Normal Language, naming, repeating,spontaneous speech     Fund of knowledge   CRANIAL NERVES: CN II: Visual fields are full to confrontation. Fundoscopic exam is normal with sharp discs and no vascular changes. Pupils are round equal and briskly reactive to light. CN III, IV, VI: extraocular movement are normal. No ptosis. CN V: Facial sensation is intact to pinprick in all 3 divisions bilaterally. Corneal responses are intact.  CN VII:  Face is symmetric with normal eye closure and smile. CN VIII: Hearing is normal to rubbing fingers CN IX, X: Palate elevates symmetrically. Phonation is normal. CN XI: Head turning and shoulder shrug are intact CN XII: Tongue is midline with normal movements and no atrophy.  MOTOR: There is no pronator drift of out-stretched arms. Muscle bulk and tone are normal. Muscle strength is normal.  REFLEXES: Reflexes are 2+ and symmetric at the biceps, triceps, knees, and ankles. Plantar responses are flexor.  SENSORY: Intact to light touch, pinprick, positional sensation and vibratory sensation are intact in fingers and toes.  COORDINATION: Rapid alternating movements and fine finger movements are intact. There is no dysmetria on finger-to-nose and heel-knee-shin.    GAIT/STANCE: Posture is normal. Gait is steady with normal steps, base, arm swing, and turning. Heel and toe walking are normal. Tandem gait is normal.  Romberg is absent.   DIAGNOSTIC DATA (LABS, IMAGING, TESTING) - I reviewed patient records, labs, notes, testing and imaging myself where available.   ASSESSMENT AND PLAN  Amber Hodge is a 42 y.o. female   Acute onset right hand paresthesia  She does not has much vascular risk factors, paresthesia mainly involving right fourth and fifth fingers,  Differentiation diagnoses include right ulnar irritation,  ESR C-reactive protein,  inflammatory markers  I will call her result, only return to clinic for new issues,     Marcial Pacas, M.D. Ph.D.  [facility] [address] [place], [place] [address] Ph: [phone] Fax: [phone]  CC: Thurnell Lose, MD

## 2017-10-18 ENCOUNTER — Telehealth: Payer: Self-pay | Admitting: *Deleted

## 2017-10-18 LAB — ANA W/REFLEX IF POSITIVE: ANA: NEGATIVE

## 2017-10-18 LAB — SEDIMENTATION RATE: Sed Rate: 2 mm/hr (ref 0–32)

## 2017-10-18 LAB — CK: CK TOTAL: 81 U/L (ref 24–173)

## 2017-10-18 LAB — FOLATE: Folate: >20 ng/mL (ref >3.0)

## 2017-10-18 LAB — C-REACTIVE PROTEIN: CRP: 1 mg/L (ref 0–10)

## 2017-10-18 LAB — VITAMIN D 25 HYDROXY (VIT D DEFICIENCY, FRACTURES): Vit D, 25-Hydroxy: 31.4 ng/mL (ref 30.0–100.0)

## 2017-10-18 NOTE — Telephone Encounter (Signed)
-----   Message from Levert FeinsteinYijun Yan, MD sent at 10/18/2017  4:13 PM EDT ----- Please call patient for no significant abnormality on laboratory evaluations.

## 2017-10-18 NOTE — Telephone Encounter (Signed)
Left patient a detailed message, with results, on voicemail (ok per DPR).  Provided our number to call back with any questions.  

## 2017-10-24 ENCOUNTER — Ambulatory Visit: Admitting: Neurology

## 2017-10-24 ENCOUNTER — Encounter

## 2019-02-19 ENCOUNTER — Other Ambulatory Visit (HOSPITAL_COMMUNITY): Payer: Self-pay | Admitting: Gastroenterology

## 2019-02-19 ENCOUNTER — Other Ambulatory Visit: Payer: Self-pay | Admitting: Gastroenterology

## 2019-02-19 DIAGNOSIS — K219 Gastro-esophageal reflux disease without esophagitis: Secondary | ICD-10-CM

## 2019-02-26 ENCOUNTER — Ambulatory Visit (HOSPITAL_COMMUNITY)

## 2019-03-13 ENCOUNTER — Other Ambulatory Visit: Payer: Self-pay

## 2019-03-13 ENCOUNTER — Ambulatory Visit (INDEPENDENT_AMBULATORY_CARE_PROVIDER_SITE_OTHER): Admitting: Cardiology

## 2019-03-13 ENCOUNTER — Encounter: Payer: Self-pay | Admitting: Cardiology

## 2019-03-13 VITALS — BP 119/73 | HR 68 | Temp 94.3°F | Ht 66.0 in | Wt 136.2 lb

## 2019-03-13 DIAGNOSIS — Z8249 Family history of ischemic heart disease and other diseases of the circulatory system: Secondary | ICD-10-CM

## 2019-03-13 DIAGNOSIS — R079 Chest pain, unspecified: Secondary | ICD-10-CM

## 2019-03-13 DIAGNOSIS — Z7189 Other specified counseling: Secondary | ICD-10-CM | POA: Diagnosis not present

## 2019-03-13 NOTE — Patient Instructions (Signed)
Medication Instructions:  Your Physician recommend you continue on your current medication as directed.    *If you need a refill on your cardiac medications before your next appointment, please call your pharmacy*  Lab Work: None  Testing/Procedures: None  Follow-Up: At [facility], you and your health needs are our priority.  As part of our continuing mission to provide you with exceptional heart care, we have created designated Provider Care Teams.  These Care Teams include your primary Cardiologist (physician) and Advanced Practice Providers (APPs -  Physician Assistants and Nurse Practitioners) who all work together to provide you with the care you need, when you need it.  Your next appointment:   As needed   The format for your next appointment:   Either In Person or Virtual  Provider:   Bridgette Christopher, MD   

## 2019-03-13 NOTE — Progress Notes (Signed)
Cardiology Office Note:    Date:  03/13/2019   ID:  Amber Hodge, DOB 1975-10-25, MRN [id number]  PCP:  Geryl Rankins, MD  Cardiologist:  Jodelle Red, MD  Referring MD: Deatra James, MD   CC: new patient evaluation for chest discomfort  History of Present Illness:    Amber Hodge is a 44 y.o. female without significant PMH who is seen as a new consult at the request of Deatra James, MD for the evaluation and management of chest discomfort.  Notes from her visit with Dr. Wynelle Link 02/14/19 reviewed prior to visit. Constant upper chest pressure since March 2020, with negative GI evaluation by Dr. Evette Cristal.   Chest pain: -Initial onset: 05/2018, swallowed a vitamin and went down the wrong way. Very painful. Has never improved. Has had GI evaluation, started on omeprazole with mild improvement. Then symptoms recurred, had EGD done. Told largely unremarkable. Still hurts when she swallows. Wanted a more natural treatment, saw an acupuncturist over the summer. Has been treated for several months, but continues to feel pressure at the base of her throat. Having an emptying study tomorrow to further evaluate GI.   Her symptoms are nonexertional. She can exercise to a high level without issue. No clear aggravating/alleviating factors. No radiation. No other associated symptoms. Has been fairly persistent since 05/2018.  Has rare/random palpitations, not concerning to her.   -Prior cardiac history: none -Prior ECG:  -Prior workup: none -Prior treatment: none -Alcohol: 1-2 glasses wine 2-3 days/week -Tobacco: former, quit ~25 years ago, was only social smoking -Comorbidities: none -Exercise level: active 4 days/week, yoga, cycling, walking, or weight training -Cardiac ROS: no shortness of breath, no PND, no orthopnea, no LE edema, no syncope -Family history: mom is living, has a heart murmur that is monitored periodically, no other issues. Father is living, no heart issues. Sister is  healthy (thyroid). MGF had MI at age 70 in his sleep, never woke up (no prior events). MGM with COPD. PGF with COPD, PGM still living at age 63. Two children, both healthy.  History reviewed. No pertinent past medical history.  Past Surgical History:  Procedure Laterality Date  . AUGMENTATION MAMMAPLASTY Bilateral   . BREAST ENHANCEMENT SURGERY Bilateral 11/2006  . WISDOM TOOTH EXTRACTION Bilateral 1999 or 2000    Current Medications: Current Outpatient Medications on File Prior to Visit  Medication Sig  . aspirin-acetaminophen-caffeine (EXCEDRIN MIGRAINE) 250-250-65 MG tablet Take 1 tablet by mouth every 6 (six) hours as needed for headache.  . b complex vitamins capsule Take 1 capsule by mouth daily.  . cetirizine (ZYRTEC) 10 MG tablet Take 10 mg by mouth daily.  Marland Kitchen CRANBERRY EXTRACT PO Take 100 mg by mouth.  . fluticasone (FLONASE) 50 MCG/ACT nasal spray Place 1 spray into both nostrils daily.  Marland Kitchen ibuprofen (ADVIL,MOTRIN) 200 MG tablet Take 600 mg by mouth every 6 (six) hours as needed for cramping.  . Lactobacillus (PROBIOTIC ACIDOPHILUS PO) Take 1 capsule by mouth daily.  . magnesium gluconate (MAGONATE) 500 MG tablet Take 500 mg by mouth daily.  . Omega-3 Fatty Acids (EQL OMEGA 3 FISH OIL PO) Take 200 mg by mouth 2 (two) times daily.   No current facility-administered medications on file prior to visit.     Allergies:   Codeine   Social History   Tobacco Use  . Smoking status: Former Smoker    Types: Cigarettes    Quit date: 03/11/2000    Years since quitting: 19.0  . Smokeless tobacco: Never Used  .  Tobacco comment: Socially, while drinking   Substance Use Topics  . Alcohol use: Yes    Alcohol/week: 4.0 standard drinks    Types: 4 Standard drinks or equivalent per week    Comment: Socially   . Drug use: No    Family History: family history includes Diabetes in her paternal grandfather; Healthy in her father; Heart attack in her maternal grandfather; Heart murmur in  her mother; Hyperlipidemia in her paternal grandfather.  ROS:   Please see the history of present illness.  Additional pertinent ROS: Constitutional: Negative for chills, fever, night sweats, unintentional weight loss  HENT: Negative for ear pain and hearing loss.   Eyes: Negative for loss of vision and eye pain.  Respiratory: Negative for cough, sputum, wheezing.   Cardiovascular: See HPI. Gastrointestinal: Negative for abdominal pain, melena, and hematochezia.  Genitourinary: Negative for dysuria and hematuria.  Musculoskeletal: Negative for falls and myalgias.  Skin: Negative for itching and rash.  Neurological: Negative for focal weakness, focal sensory changes and loss of consciousness.  Endo/Heme/Allergies: Does not bruise/bleed easily.     EKGs/Labs/Other Studies Reviewed:    The following studies were reviewed today: Notes reviewed from   EKG:  EKG is personally reviewed.  The ekg ordered today demonstrates sinus bradycardia with sinus arrhythmia, HR 58 bpm  Recent Labs: No results found for requested labs within last 8760 hours.  Recent Lipid Panel No results found for: CHOL, TRIG, HDL, CHOLHDL, VLDL, LDLCALC, LDLDIRECT  Physical Exam:    VS:  BP 119/73   Pulse 68   Temp (!) 94.3 F (34.6 C)   Ht 5\' 6"  (1.676 m)   Wt 136 lb 3.2 oz (61.8 kg)   LMP 02/28/2019 (Within Days)   SpO2 99%   BMI 21.98 kg/m     Wt Readings from Last 3 Encounters:  03/13/19 136 lb 3.2 oz (61.8 kg)  10/17/17 131 lb 8 oz (59.6 kg)  10/01/17 130 lb (59 kg)    GEN: Well nourished, well developed in no acute distress HEENT: Normal, moist mucous membranes NECK: No JVD CARDIAC: regular rhythm, normal S1 and S2, no rubs or gallops. No murmurs. VASCULAR: Radial and DP pulses 2+ bilaterally. No carotid bruits RESPIRATORY:  Clear to auscultation without rales, wheezing or rhonchi  ABDOMEN: Soft, non-tender, non-distended MUSCULOSKELETAL:  Ambulates independently SKIN: Warm and dry, no  edema NEUROLOGIC:  Alert and oriented x 3. No focal neuro deficits noted. PSYCHIATRIC:  Normal affect    ASSESSMENT:    1. Chest pain, unspecified type   2. Cardiac risk counseling   3. Counseling on health promotion and disease prevention   4. Family history of heart disease    PLAN:    Chest pain: atypical for cardiac pain. Constant for ~10 mos, nonexertional, normal ECG for age/fitness level -We spent significant time today reviewing different parts of the cardiovascular system (electrical, vascular, functional, and valvular). We discussed how each of these systems can present with different symptoms. We reviewed that there are different ways we evaluate these symptoms with tests. We reviewed which tests I think are most appropriate given the symptoms, and we discussed risks/benefits and limitations of each of these tests. Please see summary below. We also discussed that there are many noncardiac causes as well that can contribute to symptoms. -discussed treadmill stress, nuclear stress/lexiscan, and CT coronary angiography. Discussed pros and cons of each, including but not limited to false positive/false negative risk, radiation risk, and risk of IV contrast dye. Based on  shared decision making, decision was made to not pursue testing at this time. We discussed at length. She is comfortable with her very low ASCVD risk score, atypical symptoms  -discussed signs/symptoms for which she should call me, and discussed red flag warning signs that need immediate medical attention  Cardiac risk counseling and prevention recommendations: -recommend heart healthy/Mediterranean diet, with whole grains, fruits, vegetable, fish, lean meats, nuts, and olive oil. Limit salt. -recommend moderate walking, 3-5 times/week for 30-50 minutes each session. Aim for at least 150 minutes.week. Goal should be pace of 3 miles/hours, or walking 1.5 miles in 30 minutes -recommend avoidance of tobacco products. Avoid  excess alcohol. -has family history, though not premature -no additional CV risk factors -ASCVD risk score: 0.5% 10 year risk. We calculated this together and went through the different components of the test and what it means  Plan for follow up: as needed  Total time of encounter: 60 minutes total time of encounter, including 40 minutes spent in face-to-face patient care. This time includes coordination of care and counseling regarding CV risk, chest pain, and testing. Remainder of non-face-to-face time involved reviewing chart documents/testing relevant to the patient encounter and documentation in the medical record.  Jodelle Red, MD, PhD [place]  [facility]    Medication Adjustments/Labs and Tests Ordered: Current medicines are reviewed at length with the patient today.  Concerns regarding medicines are outlined above.  Orders Placed This Encounter  Procedures  . EKG 12/Charge capture   No orders of the defined types were placed in this encounter.   Patient Instructions  Medication Instructions:  Your Physician recommend you continue on your current medication as directed.    *If you need a refill on your cardiac medications before your next appointment, please call your pharmacy*  Lab Work: None  Testing/Procedures: None  Follow-Up: At [facility], you and your health needs are our priority.  As part of our continuing mission to provide you with exceptional heart care, we have created designated Provider Care Teams.  These Care Teams include your primary Cardiologist (physician) and Advanced Practice Providers (APPs -  Physician Assistants and Nurse Practitioners) who all work together to provide you with the care you need, when you need it.  Your next appointment:   As needed   The format for your next appointment:   Either In Person or Virtual  Provider:   Jodelle Red, MD     Signed, Jodelle Red, MD PhD 03/13/2019   [facility]

## 2019-03-14 ENCOUNTER — Encounter (HOSPITAL_COMMUNITY)
Admission: RE | Admit: 2019-03-14 | Discharge: 2019-03-14 | Disposition: A | Discharge status: Home / Self Care | Source: Ambulatory Visit | Attending: Gastroenterology | Admitting: Gastroenterology

## 2019-03-14 DIAGNOSIS — K219 Gastro-esophageal reflux disease without esophagitis: Secondary | ICD-10-CM

## 2019-03-14 MED ORDER — TECHNETIUM TC 99M SULFUR COLLOID: 2.0200 | Freq: Once | INTRAVENOUS | Status: DC | PRN

## 2019-03-14 MED ORDER — TECHNETIUM TC 99M SULFUR COLLOID
2.0200 | Freq: Once | INTRAVENOUS | Status: AC | PRN
  Administered 2019-03-14: 2.02 via ORAL

## 2019-03-16 ENCOUNTER — Encounter: Payer: Self-pay | Admitting: Cardiology

## 2021-04-19 ENCOUNTER — Other Ambulatory Visit: Payer: Self-pay | Admitting: Chiropractic Medicine

## 2021-04-19 ENCOUNTER — Ambulatory Visit
Admission: RE | Admit: 2021-04-19 | Discharge: 2021-04-19 | Disposition: A | Discharge status: Home / Self Care | Source: Ambulatory Visit | Attending: Chiropractic Medicine | Admitting: Chiropractic Medicine

## 2021-04-19 DIAGNOSIS — M5459 Other low back pain: Secondary | ICD-10-CM

## 2021-04-19 DIAGNOSIS — M25551 Pain in right hip: Secondary | ICD-10-CM

## 2021-05-27 ENCOUNTER — Ambulatory Visit (INDEPENDENT_AMBULATORY_CARE_PROVIDER_SITE_OTHER): Admitting: Family Medicine

## 2021-05-27 VITALS — BP 98/72 | Ht 66.0 in | Wt 130.0 lb

## 2021-05-27 DIAGNOSIS — M25551 Pain in right hip: Secondary | ICD-10-CM

## 2021-05-27 NOTE — Progress Notes (Signed)
?  Amber Hodge - 46 y.o. female MRN [id number]  Date of birth: 03-Mar-1976 ? ? ? ?SUBJECTIVE:    ?  ?Chief Complaint:/ HPI:  ?Right hip pain since about September.  No specific injury.  Started gradually.  She has been undergoing physical therapy and chiropractic treatment since then.  Typically this type of treatment regimen works for her.  The right hip has not gotten any better, in fact it is some worse.  Still very active but notes that she has specific activities that cause pain.  This includes hip flexion with knee-to-chest.  Wants to get back to her regular activities.  No lower extremity swelling. ?PERTINENT  PMH / PSH: I have reviewed the patient?s medications, allergies, past medical and surgical history, smoking status.  Pertinent findings that relate to today's visit / issues include: ?History of scoliosis. ? ? ?OBJECTIVE: BP 98/72   Ht 5\' 6"  (1.676 m)   Wt 130 lb (59 kg)   BMI 20.98 kg/m?   ?Physical Exam:  Vital signs are reviewed. ?GENERAL: Well-developed female no acute distress ?BACK: Palpation reveals dextroscoliosis.  No Trendelenburg is noted during stance or gait.  Gait does reveal that she walks with right shoulder about 3 cm lower than the left and the right leg swing involves a little bit of anterior rotation. ?HIPS: Right hip pain with axial loading and flexion to the trunk.  Otherwise I cannot reproduce her pain.  Faber/FADIR normal.  Anterior thigh muscles are nontender but she does have an area of tenderness at the hip joint itself. ? ? ?IMAGING: I reviewed the x-rays from February 2023 with her.  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  The disc space looks fairly well-maintained. ?ASSESSMENT & PLAN: ? ?See problem based charting & AVS for pt instructions. ?Right hip pain ?She has been on several months of appropriate conservative  management, physical therapy, chiropractic management and has had no improvement.  A little concerned by the pain with axial loading.  I wonder if her scoliosis is causing some unusual hip mechanics within the hip joint itself.  The labrum may be an issue.  We discussed at length.  Recommend MR arthrogram of the hip and I will follow-up with her after that ? ? ?

## 2021-05-27 NOTE — Assessment & Plan Note (Signed)
She has been on several months of appropriate conservative management, physical therapy, chiropractic management and has had no improvement.  A little concerned by the pain with axial loading.  I wonder if her scoliosis is causing some unusual hip mechanics within the hip joint itself.  The labrum may be an issue.  We discussed at length.  Recommend MR arthrogram of the hip and I will follow-up with her after that ?

## 2021-06-09 ENCOUNTER — Encounter: Payer: Self-pay | Admitting: Family Medicine

## 2021-06-17 ENCOUNTER — Other Ambulatory Visit

## 2021-06-24 ENCOUNTER — Other Ambulatory Visit

## 2021-07-05 ENCOUNTER — Encounter: Payer: Self-pay | Admitting: Gastroenterology

## 2021-07-27 ENCOUNTER — Ambulatory Visit: Admitting: Gastroenterology

## 2022-02-09 ENCOUNTER — Ambulatory Visit: Attending: Nurse Practitioner | Admitting: Physical Therapy

## 2022-02-09 ENCOUNTER — Other Ambulatory Visit: Payer: Self-pay

## 2022-02-09 DIAGNOSIS — N814 Uterovaginal prolapse, unspecified: Secondary | ICD-10-CM | POA: Insufficient documentation

## 2022-02-09 DIAGNOSIS — M6281 Muscle weakness (generalized): Secondary | ICD-10-CM | POA: Diagnosis not present

## 2022-02-09 DIAGNOSIS — R293 Abnormal posture: Secondary | ICD-10-CM | POA: Diagnosis not present

## 2022-02-09 DIAGNOSIS — R279 Unspecified lack of coordination: Secondary | ICD-10-CM | POA: Diagnosis not present

## 2022-02-09 DIAGNOSIS — N393 Stress incontinence (female) (male): Secondary | ICD-10-CM | POA: Insufficient documentation

## 2022-02-09 NOTE — Therapy (Signed)
OUTPATIENT PHYSICAL THERAPY FEMALE PELVIC EVALUATION   Patient Name: Amber Hodge MRN: [id number] DOB:19-Jul-1975, 46 y.o., female Today's Date: 02/09/2022  END OF SESSION:  PT End of Session - 02/09/22 1007     Visit Number 1    Date for PT Re-Evaluation 05/11/22    Authorization Type [facility]    PT Start Time 0930    PT Stop Time 1010    PT Time Calculation (min) 40 min    Activity Tolerance Patient tolerated treatment well    Behavior During Therapy [facility] for tasks assessed/performed             No past medical history on file. Past Surgical History:  Procedure Laterality Date   AUGMENTATION MAMMAPLASTY Bilateral    BREAST ENHANCEMENT SURGERY Bilateral 11/2006   WISDOM TOOTH EXTRACTION Bilateral 1999 or 2000   Patient Active Problem List   Diagnosis Date Noted   Right hip pain 05/27/2021   Myalgia 10/17/2017   Paresthesia 10/17/2017    PCP: Geryl Rankins, MD  REFERRING PROVIDER: Lavonna Monarch, FNP  REFERRING DIAG: N81.4 (ICD-10-CM) - Uterovaginal prolapse, unspecified N39.3 (ICD-10-CM) - Stress incontinence (female) (female)  THERAPY DIAG:  Muscle weakness (generalized)  Abnormal posture  Unspecified lack of coordination  Rationale for Evaluation and Treatment: Rehabilitation  ONSET DATE: 1997 with first baby  SUBJECTIVE:                                                                                                                                                                                           SUBJECTIVE STATEMENT: Pt reports decreased bladder control, wearing liner all the time but not at night. Urinary leakage with stressors. Never has urge incontinence. Reports she frequently uses restroom just in case and has lot of fluids during the day 16 oz lemon water in am, 64 in afternoon, celery juice, does have coffee in am as well.      PAIN:  Are you having pain? No   PRECAUTIONS: None  WEIGHT BEARING RESTRICTIONS: No  FALLS:   Has patient fallen in last 6 months? 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, retired Producer, television/film/video   PLOF: Independent  PATIENT GOALS: to have no leakage  PERTINENT HISTORY:  X3 vaginal births at least one with tearing, does have back pain sees chiropractor  Sexual abuse: No  BOWEL MOVEMENT: Pain with bowel movement: No Type of bowel movement:Type (Bristol Stool Scale) 4, Frequency daily, and Strain No Fully empty rectum: Yes:   Leakage: No Pads: No Fiber supplement: No  URINATION: Pain with urination: No  Fully empty bladder: Yes:   Stream: Strong Urgency: No Frequency: "goes a lot but unsure how much", 1x at night Leakage: Coughing, Sneezing, Exercise, and Lifting Pads: Yes: liners - 2/3x per day  INTERCOURSE: Pain with intercourse:  not painful Ability to have vaginal penetration:  Yes:   Climax: not painful Marinoff Scale: 0/3  PREGNANCY: Vaginal deliveries 3 Tearing Yes: first with biggest tear, no tearing with second, can't remember third  C-section deliveries 0 Currently pregnant No  PROLAPSE: None    OBJECTIVE:   DIAGNOSTIC FINDINGS:    COGNITION: Overall cognitive status: Within functional limits for tasks assessed     SENSATION: Light touch: Appears intact Proprioception: Appears intact  MUSCLE LENGTH: Hamstrings and adductors limited by 25%  POSTURE: rounded shoulders and forward head  PELVIC ALIGNMENT: rt obliquity   LUMBARAROM/PROM:  A/PROM A/PROM  eval  Flexion WFL  Extension WFL  Right lateral flexion WFL  Left lateral flexion Limited by 25%  Right rotation Limited by 25%  Left rotation Limited by 25%   (Blank rows = not tested)  LOWER EXTREMITY ROM:  WFL  LOWER EXTREMITY MMT:  Bil hips grossly 4/5, knees and ankles 5/5 PALPATION:   General  no TTP, Rt abdominal fascial restrictions noted with tension felt per pt                  External Perineal Exam no TTP                             Internal Pelvic Floor no TTP  Patient confirms identification and approves PT to assess internal pelvic floor and treatment Yes  PELVIC MMT:   MMT eval  Vaginal 3/5, 8s, 8 reps  Internal Anal Sphincter   External Anal Sphincter   Puborectalis   Diastasis Recti   (Blank rows = not tested)        TONE: WFL  PROLAPSE: Grade one anterior laxity with cough, improved with coordination of pelvic floor contraction and cough but mild descent still noted.   TODAY'S TREATMENT:                                                                                                                              DATE:   02/09/22 EVAL Examination completed, findings reviewed, pt educated on POC, HEP, the knack method, and bladder irritants. Pt motivated to participate in PT and agreeable to attempt recommendations.     PATIENT EDUCATION:  Education details: [phone] Person educated: Patient Education method: Explanation, Demonstration, Tactile cues, Verbal cues, and Handouts Education comprehension: verbalized understanding and returned demonstration  HOME EXERCISE PROGRAM: [id number]  ASSESSMENT:  CLINICAL IMPRESSION: Patient is a 46 y.o. female  who was seen today for physical therapy evaluation and treatment for urinary leakage and grade one prolapse. Pt reports she is active and wants to be able to do workouts without leakage or worsening of prolapse.  Pt states she does drink a lot of fluids throughout the day and urinates somewhat frequently but unsure how often, doesn't ever have urge incontinence only with stressors of sneezing, coughing and working out or lifting/sudden. Pt also reports Rt anterior hip and rt lateral/low back pain and does have increased tension of muscle tissue throughout these areas. Pt has mild decreased spinal flexibility and bil hips, decreased hip strength and core strength mildly, fascial restrictions at rt  abdominal quadrants. Pt consented to internal vaginal assessment this date and found to have decreased strength, coordination, and endurance and grade one anterior wall laxity. Pt would benefit from additional PT to further address deficits.    OBJECTIVE IMPAIRMENTS: decreased coordination, decreased endurance, decreased strength, increased fascial restrictions, impaired flexibility, improper body mechanics, and postural dysfunction.   ACTIVITY LIMITATIONS: carrying, lifting, squatting, continence, and locomotion level  PARTICIPATION LIMITATIONS: community activity  PERSONAL FACTORS: Time since onset of injury/illness/exacerbation and 1 comorbidity: x3 vaginal births with tearing for at least first  are also affecting patient's functional outcome.   REHAB POTENTIAL: Good  CLINICAL DECISION MAKING: Stable/uncomplicated  EVALUATION COMPLEXITY: Low   GOALS: Goals reviewed with patient? Yes  SHORT TERM GOALS: Target date: 03/09/22  Pt to be I with HEP.  Baseline: Goal status: INITIAL  2.  Pt to report improved time between bladder voids to at least 2 hours for improved QOL with decreased urinary frequency.   Baseline:  Goal status: INITIAL  3.  Pt to report no more than one instance of leakage with working out on her own per session to improve tolerance to activity.  Baseline:  Goal status: INITIAL  4.  Pt to be I with coordination of pelvic floor and breathing mechanics with body weight squats without cues for improved pelvic stability and decreased leakage.  Baseline:  Goal status: INITIAL   LONG TERM GOALS: Target date: 05/11/22  Pt to be I with advanced HEP.  Baseline:  Goal status: INITIAL  2.  Pt to be I with coordination of pelvic floor and breathing mechanics with 20# squats without cues for improved pelvic stability and decreased leakage.  Baseline:  Goal status: INITIAL  3.  Pt to demonstrate at least 4/5 pelvic floor strength for improved pelvic stability and  decreased strain at pelvic floor/ decrease leakage.  Baseline:  Goal status: INITIAL  4.  Pt to report improved time between bladder voids to at least 2.5 hours for improved QOL with decreased urinary frequency.   Baseline:  Goal status: INITIAL  5.  Pt will be able to functional actions such as forward and lateral jumping with minimal to no leakage for improved tolerance to increased challenge of exercises.  Baseline:  Goal status: INITIAL  PLAN:  PT FREQUENCY: 1x/week  PT DURATION:  8 sessions  PLANNED INTERVENTIONS: Therapeutic exercises, Therapeutic activity, Neuromuscular re-education, Patient/Family education, Self Care, Joint mobilization, Dry Needling, Electrical stimulation, Spinal mobilization, Cryotherapy, Moist heat, scar mobilization, Taping, Biofeedback, and Manual therapy  PLAN FOR NEXT SESSION: manual at abdomen and low back as needed, core and hip strengthening, single leg tasks, jumping, quick motion training, coordination of pelvic floor and breathing with tasks, knack with everything.  Otelia Sergeant, PT, DPT 02/09/2309:29 AM

## 2022-02-09 NOTE — Patient Instructions (Signed)

## 2022-03-02 ENCOUNTER — Ambulatory Visit: Admitting: Physical Therapy

## 2022-03-02 DIAGNOSIS — M6281 Muscle weakness (generalized): Secondary | ICD-10-CM

## 2022-03-02 DIAGNOSIS — R279 Unspecified lack of coordination: Secondary | ICD-10-CM

## 2022-03-02 DIAGNOSIS — N814 Uterovaginal prolapse, unspecified: Secondary | ICD-10-CM | POA: Diagnosis not present

## 2022-03-02 DIAGNOSIS — R293 Abnormal posture: Secondary | ICD-10-CM

## 2022-03-02 NOTE — Therapy (Signed)
OUTPATIENT PHYSICAL THERAPY FEMALE PELVIC TREATMENT   Patient Name: Amber Hodge MRN: [id number] DOB:Dec 28, 1975, 46 y.o., female Today's Date: 03/02/2022  END OF SESSION:  PT End of Session - 03/02/22 0847     Visit Number 2    Date for PT Re-Evaluation 05/11/22    Authorization Type [employer]    PT Start Time 0845    PT Stop Time 0923    PT Time Calculation (min) 38 min    Activity Tolerance Patient tolerated treatment well    Behavior During Therapy [facility] for tasks assessed/performed              No past medical history on file. Past Surgical History:  Procedure Laterality Date   AUGMENTATION MAMMAPLASTY Bilateral    BREAST ENHANCEMENT SURGERY Bilateral 11/2006   WISDOM TOOTH EXTRACTION Bilateral 1999 or 2000   Patient Active Problem List   Diagnosis Date Noted   Right hip pain 05/27/2021   Myalgia 10/17/2017   Paresthesia 10/17/2017    PCP: Geryl Rankins, MD  REFERRING PROVIDER: Lavonna Monarch, FNP  REFERRING DIAG: N81.4 (ICD-10-CM) - Uterovaginal prolapse, unspecified N39.3 (ICD-10-CM) - Stress incontinence (female) (female)  THERAPY DIAG:  Muscle weakness (generalized)  Abnormal posture  Unspecified lack of coordination  Rationale for Evaluation and Treatment: Rehabilitation  ONSET DATE: 1997 with first baby  SUBJECTIVE:                                                                                                                                                                                           SUBJECTIVE STATEMENT: Pt reports she has been doing HEP and going well has seen slight improvement with leakage.      PAIN:  Are you having pain? No   PRECAUTIONS: None  WEIGHT BEARING RESTRICTIONS: No  FALLS:  Has patient fallen in last 6 months? 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, retired Producer, television/film/video   PLOF: Independent  PATIENT  GOALS: to have no leakage  PERTINENT HISTORY:  X3 vaginal births at least one with tearing, does have back pain sees chiropractor  Sexual abuse: No  BOWEL MOVEMENT: Pain with bowel movement: No Type of bowel movement:Type (Bristol Stool Scale) 4, Frequency daily, and Strain No Fully empty rectum: Yes:   Leakage: No Pads: No Fiber supplement: No  URINATION: Pain with urination: No Fully empty bladder: Yes:   Stream: Strong Urgency: No Frequency: "goes a lot but unsure how much", 1x at night Leakage: Coughing, Sneezing, Exercise, and Lifting Pads: Yes: liners - 2/3x per day  INTERCOURSE: Pain with intercourse:  not painful Ability to have vaginal penetration:  Yes:   Climax: not painful Marinoff Scale: 0/3  PREGNANCY: Vaginal deliveries 3 Tearing Yes: first with biggest tear, no tearing with second, can't remember third  C-section deliveries 0 Currently pregnant No  PROLAPSE: None    OBJECTIVE:   DIAGNOSTIC FINDINGS:    COGNITION: Overall cognitive status: Within functional limits for tasks assessed     SENSATION: Light touch: Appears intact Proprioception: Appears intact  MUSCLE LENGTH: Hamstrings and adductors limited by 25%  POSTURE: rounded shoulders and forward head  PELVIC ALIGNMENT: rt obliquity   LUMBARAROM/PROM:  A/PROM A/PROM  eval  Flexion WFL  Extension WFL  Right lateral flexion WFL  Left lateral flexion Limited by 25%  Right rotation Limited by 25%  Left rotation Limited by 25%   (Blank rows = not tested)  LOWER EXTREMITY ROM:  WFL  LOWER EXTREMITY MMT:  Bil hips grossly 4/5, knees and ankles 5/5 PALPATION:   General  no TTP, Rt abdominal fascial restrictions noted with tension felt per pt                 External Perineal Exam no TTP                             Internal Pelvic Floor no TTP  Patient confirms identification and approves PT to assess internal pelvic floor and treatment Yes  PELVIC MMT:   MMT eval   Vaginal 3/5, 8s, 8 reps  Internal Anal Sphincter   External Anal Sphincter   Puborectalis   Diastasis Recti   (Blank rows = not tested)        TONE: WFL  PROLAPSE: Grade one anterior laxity with cough, improved with coordination of pelvic floor contraction and cough but mild descent still noted.   TODAY'S TREATMENT:                                                                                                                              DATE:   03/02/22: NMRE: all exercises cued for coordination of breathing mechanics, core activation and pelvic floor activation for improved pelvic floor strength and decreased leakage.  2x10 bridges with adductor ball squeeze 2x10 opp hand knee ball press Quad bird dogs with green band 2x10 Bosu lateral squats with hop back switch x10 each Hop in place 2x10 Lateral hops x10 each Palloffs cable x10 each 5# 10# staggered RDL trunk rotation into open OHP 2x10 10# single leg stance from mat x10 each Marjo Bicker pose 2x30s  Trunk rotation childs pose 2x30s each   PATIENT EDUCATION:  Education details: [id number] Person educated: Patient Education method: Explanation, Demonstration, Tactile cues, Verbal cues, and Handouts Education comprehension: verbalized understanding and returned demonstration  HOME EXERCISE PROGRAM: [id number]  ASSESSMENT:  CLINICAL IMPRESSION: Patient session focused on hip and core strengthening with standing and quick paced exercises for improved challenge at pelvic floor.  Pt did have minimal leakage with initial attempts at hopping but improved with cues for mechanics and coordination of breathing and pelvic floor activation. Pt tolerated well with rest breaks. Pt would benefit from additional PT to further address deficits.    OBJECTIVE IMPAIRMENTS: decreased coordination, decreased endurance, decreased strength, increased fascial restrictions, impaired flexibility, improper body mechanics, and postural dysfunction.    ACTIVITY LIMITATIONS: carrying, lifting, squatting, continence, and locomotion level  PARTICIPATION LIMITATIONS: community activity  PERSONAL FACTORS: Time since onset of injury/illness/exacerbation and 1 comorbidity: x3 vaginal births with tearing for at least first  are also affecting patient's functional outcome.   REHAB POTENTIAL: Good  CLINICAL DECISION MAKING: Stable/uncomplicated  EVALUATION COMPLEXITY: Low   GOALS: Goals reviewed with patient? Yes  SHORT TERM GOALS: Target date: 03/09/22  Pt to be I with HEP.  Baseline: Goal status: INITIAL  2.  Pt to report improved time between bladder voids to at least 2 hours for improved QOL with decreased urinary frequency.   Baseline:  Goal status: INITIAL  3.  Pt to report no more than one instance of leakage with working out on her own per session to improve tolerance to activity.  Baseline:  Goal status: INITIAL  4.  Pt to be I with coordination of pelvic floor and breathing mechanics with body weight squats without cues for improved pelvic stability and decreased leakage.  Baseline:  Goal status: INITIAL   LONG TERM GOALS: Target date: 05/11/22  Pt to be I with advanced HEP.  Baseline:  Goal status: INITIAL  2.  Pt to be I with coordination of pelvic floor and breathing mechanics with 20# squats without cues for improved pelvic stability and decreased leakage.  Baseline:  Goal status: INITIAL  3.  Pt to demonstrate at least 4/5 pelvic floor strength for improved pelvic stability and decreased strain at pelvic floor/ decrease leakage.  Baseline:  Goal status: INITIAL  4.  Pt to report improved time between bladder voids to at least 2.5 hours for improved QOL with decreased urinary frequency.   Baseline:  Goal status: INITIAL  5.  Pt will be able to functional actions such as forward and lateral jumping with minimal to no leakage for improved tolerance to increased challenge of exercises.  Baseline:  Goal  status: INITIAL  PLAN:  PT FREQUENCY: 1x/week  PT DURATION:  8 sessions  PLANNED INTERVENTIONS: Therapeutic exercises, Therapeutic activity, Neuromuscular re-education, Patient/Family education, Self Care, Joint mobilization, Dry Needling, Electrical stimulation, Spinal mobilization, Cryotherapy, Moist heat, scar mobilization, Taping, Biofeedback, and Manual therapy  PLAN FOR NEXT SESSION: manual at abdomen and low back as needed, core and hip strengthening, single leg tasks, jumping, quick motion training, coordination of pelvic floor and breathing with tasks, knack with everything.  Otelia Sergeant, PT, DPT 12/28/239:24 AM

## 2022-03-10 ENCOUNTER — Ambulatory Visit: Attending: Nurse Practitioner | Admitting: Physical Therapy

## 2022-03-10 DIAGNOSIS — M6281 Muscle weakness (generalized): Secondary | ICD-10-CM | POA: Diagnosis present

## 2022-03-10 DIAGNOSIS — R293 Abnormal posture: Secondary | ICD-10-CM | POA: Insufficient documentation

## 2022-03-10 DIAGNOSIS — R279 Unspecified lack of coordination: Secondary | ICD-10-CM

## 2022-03-10 NOTE — Therapy (Signed)
OUTPATIENT PHYSICAL THERAPY FEMALE PELVIC TREATMENT   Patient Name: Amber Hodge MRN: [id number] DOB:1975-09-16, 47 y.o., female Today's Date: 03/10/2022  END OF SESSION:  PT End of Session - 03/10/22 0937     Visit Number 3    Date for PT Re-Evaluation 05/11/22    Authorization Type [employer]    PT Start Time 0935    PT Stop Time 1010    PT Time Calculation (min) 35 min    Activity Tolerance Patient tolerated treatment well    Behavior During Therapy [facility] for tasks assessed/performed              No past medical history on file. Past Surgical History:  Procedure Laterality Date   AUGMENTATION MAMMAPLASTY Bilateral    BREAST ENHANCEMENT SURGERY Bilateral 11/2006   WISDOM TOOTH EXTRACTION Bilateral 1999 or 2000   Patient Active Problem List   Diagnosis Date Noted   Right hip pain 05/27/2021   Myalgia 10/17/2017   Paresthesia 10/17/2017    PCP: Geryl Rankins, MD  REFERRING PROVIDER: Lavonna Monarch, FNP  REFERRING DIAG: N81.4 (ICD-10-CM) - Uterovaginal prolapse, unspecified N39.3 (ICD-10-CM) - Stress incontinence (female) (female)  THERAPY DIAG:  Muscle weakness (generalized)  Abnormal posture  Unspecified lack of coordination  Rationale for Evaluation and Treatment: Rehabilitation  ONSET DATE: 1997 with first baby  SUBJECTIVE:                                                                                                                                                                                           SUBJECTIVE STATEMENT: Pt reports about the same progress as last session.     PAIN:  Are you having pain? No   PRECAUTIONS: None  WEIGHT BEARING RESTRICTIONS: No  FALLS:  Has patient fallen in last 6 months? 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, retired Producer, television/film/video   PLOF: Independent  PATIENT GOALS: to have no leakage  PERTINENT  HISTORY:  X3 vaginal births at least one with tearing, does have back pain sees chiropractor  Sexual abuse: No  BOWEL MOVEMENT: Pain with bowel movement: No Type of bowel movement:Type (Bristol Stool Scale) 4, Frequency daily, and Strain No Fully empty rectum: Yes:   Leakage: No Pads: No Fiber supplement: No  URINATION: Pain with urination: No Fully empty bladder: Yes:   Stream: Strong Urgency: No Frequency: "goes a lot but unsure how much", 1x at night Leakage: Coughing, Sneezing, Exercise, and Lifting Pads: Yes: liners - 2/3x per day  INTERCOURSE: Pain with intercourse:  not painful Ability to have vaginal penetration:  Yes:   Climax: not painful Marinoff Scale: 0/3  PREGNANCY: Vaginal deliveries 3 Tearing Yes: first with biggest tear, no tearing with second, can't remember third  C-section deliveries 0 Currently pregnant No  PROLAPSE: None    OBJECTIVE:   DIAGNOSTIC FINDINGS:    COGNITION: Overall cognitive status: Within functional limits for tasks assessed     SENSATION: Light touch: Appears intact Proprioception: Appears intact  MUSCLE LENGTH: Hamstrings and adductors limited by 25%  POSTURE: rounded shoulders and forward head  PELVIC ALIGNMENT: rt obliquity   LUMBARAROM/PROM:  A/PROM A/PROM  eval  Flexion WFL  Extension WFL  Right lateral flexion WFL  Left lateral flexion Limited by 25%  Right rotation Limited by 25%  Left rotation Limited by 25%   (Blank rows = not tested)  LOWER EXTREMITY ROM:  WFL  LOWER EXTREMITY MMT:  Bil hips grossly 4/5, knees and ankles 5/5 PALPATION:   General  no TTP, Rt abdominal fascial restrictions noted with tension felt per pt                 External Perineal Exam no TTP                             Internal Pelvic Floor no TTP  Patient confirms identification and approves PT to assess internal pelvic floor and treatment Yes  PELVIC MMT:   MMT eval  Vaginal 3/5, 8s, 8 reps  Internal Anal  Sphincter   External Anal Sphincter   Puborectalis   Diastasis Recti   (Blank rows = not tested)        TONE: WFL  PROLAPSE: Grade one anterior laxity with cough, improved with coordination of pelvic floor contraction and cough but mild descent still noted.   TODAY'S TREATMENT:                                                                                                                              DATE:   03/10/22  NMRE: all exercises cued for coordination of breathing mechanics, core activation and pelvic floor activation for improved pelvic floor strength and decreased leakage.  Bosu: body weight squats 2x10, lateral squats x10 each  Single leg Sit to stand from standard chair x10 each 2x10 bridges  Agility ladder: x4 forward quick steps, x4 each lateral quick steps, x2 180 jumps each way Bridges 2x10   PATIENT EDUCATION:  Education details: [id number] Person educated: Patient Education method: Explanation, Demonstration, Tactile cues, Verbal cues, and Handouts Education comprehension: verbalized understanding and returned demonstration  HOME EXERCISE PROGRAM: [id number]  ASSESSMENT:  CLINICAL IMPRESSION: Patient session focused on hip and core strengthening with standing and quick paced exercises for improved challenge at pelvic floor. Pt did not have leakage today, cues for mechanics and did need to stop with agility ladder hopping x2 for urge drill but able to stop urgency and prevent leakage. Pt tolerated well with  rest breaks. Pt would benefit from additional PT to further address deficits.    OBJECTIVE IMPAIRMENTS: decreased coordination, decreased endurance, decreased strength, increased fascial restrictions, impaired flexibility, improper body mechanics, and postural dysfunction.   ACTIVITY LIMITATIONS: carrying, lifting, squatting, continence, and locomotion level  PARTICIPATION LIMITATIONS: community activity  PERSONAL FACTORS: Time since onset of  injury/illness/exacerbation and 1 comorbidity: x3 vaginal births with tearing for at least first  are also affecting patient's functional outcome.   REHAB POTENTIAL: Good  CLINICAL DECISION MAKING: Stable/uncomplicated  EVALUATION COMPLEXITY: Low   GOALS: Goals reviewed with patient? Yes  SHORT TERM GOALS: Target date: 03/09/22  Pt to be I with HEP.  Baseline: Goal status: INITIAL  2.  Pt to report improved time between bladder voids to at least 2 hours for improved QOL with decreased urinary frequency.   Baseline:  Goal status: INITIAL  3.  Pt to report no more than one instance of leakage with working out on her own per session to improve tolerance to activity.  Baseline:  Goal status: INITIAL  4.  Pt to be I with coordination of pelvic floor and breathing mechanics with body weight squats without cues for improved pelvic stability and decreased leakage.  Baseline:  Goal status: INITIAL   LONG TERM GOALS: Target date: 05/11/22  Pt to be I with advanced HEP.  Baseline:  Goal status: INITIAL  2.  Pt to be I with coordination of pelvic floor and breathing mechanics with 20# squats without cues for improved pelvic stability and decreased leakage.  Baseline:  Goal status: INITIAL  3.  Pt to demonstrate at least 4/5 pelvic floor strength for improved pelvic stability and decreased strain at pelvic floor/ decrease leakage.  Baseline:  Goal status: INITIAL  4.  Pt to report improved time between bladder voids to at least 2.5 hours for improved QOL with decreased urinary frequency.   Baseline:  Goal status: INITIAL  5.  Pt will be able to functional actions such as forward and lateral jumping with minimal to no leakage for improved tolerance to increased challenge of exercises.  Baseline:  Goal status: INITIAL  PLAN:  PT FREQUENCY: 1x/week  PT DURATION:  8 sessions  PLANNED INTERVENTIONS: Therapeutic exercises, Therapeutic activity, Neuromuscular re-education,  Patient/Family education, Self Care, Joint mobilization, Dry Needling, Electrical stimulation, Spinal mobilization, Cryotherapy, Moist heat, scar mobilization, Taping, Biofeedback, and Manual therapy  PLAN FOR NEXT SESSION: manual at abdomen and low back as needed, core and hip strengthening, single leg tasks, jumping, quick motion training, coordination of pelvic floor and breathing with tasks, knack with everything.  Stacy Gardner, PT, DPT 03/10/2408:11 AM

## 2022-03-17 ENCOUNTER — Ambulatory Visit: Admitting: Physical Therapy

## 2022-03-17 ENCOUNTER — Encounter: Payer: Self-pay | Admitting: Physical Therapy

## 2022-03-17 DIAGNOSIS — M6281 Muscle weakness (generalized): Secondary | ICD-10-CM

## 2022-03-17 DIAGNOSIS — R279 Unspecified lack of coordination: Secondary | ICD-10-CM

## 2022-03-17 DIAGNOSIS — R293 Abnormal posture: Secondary | ICD-10-CM

## 2022-03-17 NOTE — Therapy (Signed)
OUTPATIENT PHYSICAL THERAPY TREATMENT NOTE   Patient Name: Amber Hodge MRN: [id number] DOB:14-Jan-1976, 47 y.o., female Today's Date: 03/17/2022  PCP: Thurnell Lose, MD  REFERRING PROVIDER: Ma Hillock, FNP   END OF SESSION:   PT End of Session - 03/17/22 0932     Visit Number 4    Date for PT Re-Evaluation 05/11/22    Authorization Type [employer]    PT Start Time 0930    PT Stop Time 1010    PT Time Calculation (min) 40 min    Activity Tolerance Patient tolerated treatment well    Behavior During Therapy [facility] for tasks assessed/performed             History reviewed. No pertinent past medical history. Past Surgical History:  Procedure Laterality Date   AUGMENTATION MAMMAPLASTY Bilateral    BREAST ENHANCEMENT SURGERY Bilateral 11/2006   WISDOM TOOTH EXTRACTION Bilateral 1999 or 2000   Patient Active Problem List   Diagnosis Date Noted   Right hip pain 05/27/2021   Myalgia 10/17/2017   Paresthesia 10/17/2017   REFERRING DIAG: N81.4 (ICD-10-CM) - Uterovaginal prolapse, unspecified N39.3 (ICD-10-CM) - Stress incontinence (female) (female)   THERAPY DIAG:  Muscle weakness (generalized)   Abnormal posture   Unspecified lack of coordination   Rationale for Evaluation and Treatment: Rehabilitation   ONSET DATE: 1997 with first baby   SUBJECTIVE:                                                                                                                                                                                            SUBJECTIVE STATEMENT: I am more aware of the breathing and exercises.     PAIN:  Are you having pain? No     PRECAUTIONS: None   WEIGHT BEARING RESTRICTIONS: No   FALLS:  Has patient fallen in last 6 months? 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, retired Emergency planning/management officer    PLOF: Independent   PATIENT GOALS: to have no leakage    PERTINENT HISTORY:  X3 vaginal births at least one with tearing, does have back pain sees chiropractor  Sexual abuse: No   BOWEL MOVEMENT: Pain with bowel movement: No Type of bowel movement:Type (Bristol Stool Scale) 4, Frequency daily, and Strain No Fully empty rectum: Yes:   Leakage: No Pads: No Fiber supplement: No   URINATION: Pain with urination: No Fully empty bladder: Yes:   Stream: Strong Urgency: No Frequency: "goes a lot but unsure how much", 1x at night Leakage: Coughing, Sneezing, Exercise, and Lifting Pads:  Yes: liners - 2/3x per day   INTERCOURSE: Pain with intercourse:  not painful Ability to have vaginal penetration:  Yes:   Climax: not painful Marinoff Scale: 0/3   PREGNANCY: Vaginal deliveries 3 Tearing Yes: first with biggest tear, no tearing with second, can't remember third  C-section deliveries 0 Currently pregnant No   PROLAPSE: None      OBJECTIVE:    DIAGNOSTIC FINDINGS:      COGNITION: Overall cognitive status: Within functional limits for tasks assessed                          SENSATION: Light touch: Appears intact Proprioception: Appears intact   MUSCLE LENGTH: Hamstrings and adductors limited by 25%   POSTURE: rounded shoulders and forward head   PELVIC ALIGNMENT: rt obliquity    LUMBARAROM/PROM:   A/PROM A/PROM  eval  Flexion WFL  Extension WFL  Right lateral flexion WFL  Left lateral flexion Limited by 25%  Right rotation Limited by 25%  Left rotation Limited by 25%   (Blank rows = not tested)   LOWER EXTREMITY ROM:   WFL   LOWER EXTREMITY MMT:   Bil hips grossly 4/5, knees and ankles 5/5 PALPATION:   General  no TTP, Rt abdominal fascial restrictions noted with tension felt per pt                  External Perineal Exam no TTP                             Internal Pelvic Floor no TTP   Patient confirms identification and approves PT to assess internal pelvic floor and treatment Yes   PELVIC MMT:    MMT eval 03/17/22  Vaginal 3/5, 8s, 8 reps 4/5 with good hug of therapist finger  (Blank rows = not tested)         TONE: WFL   PROLAPSE: Grade one anterior laxity with cough, improved with coordination of pelvic floor contraction and cough but mild descent still noted.    TODAY'S TREATMENT:       03/17/2022 Manual: Scar tissue mobilization: Release along the perineal scar to increase mobility Myofascial release: Release of the urogenital diaphragm Internal pelvic floor techniques: No emotional/communication barriers or cognitive limitation. Patient is motivated to learn. Patient understands and agrees with treatment goals and plan. PT explains patient will be examined in standing, sitting, and lying down to see how their muscles and joints work. When they are ready, they will be asked to remove their underwear so PT can examine their perineum. The patient is also given the option of providing their own chaperone as one is not provided in our facility. The patient also has the right and is explained the right to defer or refuse any part of the evaluation or treatment including the internal exam. With the patient's consent, PT will use one gloved finger to gently assess the muscles of the pelvic floor, seeing how well it contracts and relaxes and if there is muscle symmetry. After, the patient will get dressed and PT and patient will discuss exam findings and plan of care. PT and patient discuss plan of care, schedule, attendance policy and HEP activities.  Going through the vaginal canal working on the posterior wall to improve tissue mobility Going through the vaginal canal with fascial release along the sides of the bladder and urethra with other hand on  the lower abdominal area.                                                                                                                            DATE:   03/10/22  NMRE: all exercises cued for coordination of breathing mechanics, core  activation and pelvic floor activation for improved pelvic floor strength and decreased leakage.  Bosu: body weight squats 2x10, lateral squats x10 each  Single leg Sit to stand from standard chair x10 each 2x10 bridges  Agility ladder: x4 forward quick steps, x4 each lateral quick steps, x2 180 jumps each way Bridges 2x10     PATIENT EDUCATION:  Education details: [id number] Person educated: Patient Education method: Explanation, Demonstration, Tactile cues, Verbal cues, and Handouts Education comprehension: verbalized understanding and returned demonstration   HOME EXERCISE PROGRAM: [email or web address]   ASSESSMENT:   CLINICAL IMPRESSION: Patient had tightness along the perineal body from tearing during vaginal birth. She had fascial restrictions along the urethra and sides of the bladder. Patient had improved softness and tissue mobility after manual work. Her initial contraction was 3/5 but after manual work increased to 4/5 with good hug of therapist finger. Pt would benefit from additional PT to further address deficits.     OBJECTIVE IMPAIRMENTS: decreased coordination, decreased endurance, decreased strength, increased fascial restrictions, impaired flexibility, improper body mechanics, and postural dysfunction.    ACTIVITY LIMITATIONS: carrying, lifting, squatting, continence, and locomotion level   PARTICIPATION LIMITATIONS: community activity   PERSONAL FACTORS: Time since onset of injury/illness/exacerbation and 1 comorbidity: x3 vaginal births with tearing for at least first  are also affecting patient's functional outcome.    REHAB POTENTIAL: Good   CLINICAL DECISION MAKING: Stable/uncomplicated   EVALUATION COMPLEXITY: Low     GOALS: Goals reviewed with patient? Yes   SHORT TERM GOALS: Target date: 03/09/22   Pt to be I with HEP.  Baseline: Goal status: 03/17/22   2.  Pt to report improved time between bladder voids to at least 2 hours for improved QOL with decreased  urinary frequency.   Baseline: urinate 1-1.5 hours  Goal status: ongoing 03/17/2022   3.  Pt to report no more than one instance of leakage with working out on her own per session to improve tolerance to activity.  Baseline:  Goal status: Met 03/17/2022   4.  Pt to be I with coordination of pelvic floor and breathing mechanics with body weight squats without cues for improved pelvic stability and decreased leakage.  Baseline:  Goal status: ongoing 03/17/2022     LONG TERM GOALS: Target date: 05/11/22   Pt to be I with advanced HEP.  Baseline:  Goal status: INITIAL   2.  Pt to be I with coordination of pelvic floor and breathing mechanics with 20# squats without cues for improved pelvic stability and decreased leakage.  Baseline:  Goal status: INITIAL   3.  Pt to demonstrate at least 4/5 pelvic floor strength for improved  pelvic stability and decreased strain at pelvic floor/ decrease leakage.  Baseline:  Goal status: INITIAL   4.  Pt to report improved time between bladder voids to at least 2.5 hours for improved QOL with decreased urinary frequency.   Baseline:  Goal status: INITIAL   5.  Pt will be able to functional actions such as forward and lateral jumping with minimal to no leakage for improved tolerance to increased challenge of exercises.  Baseline:  Goal status: INITIAL   PLAN:   PT FREQUENCY: 1x/week   PT DURATION:  8 sessions   PLANNED INTERVENTIONS: Therapeutic exercises, Therapeutic activity, Neuromuscular re-education, Patient/Family education, Self Care, Joint mobilization, Dry Needling, Electrical stimulation, Spinal mobilization, Cryotherapy, Moist heat, scar mobilization, Taping, Biofeedback, and Manual therapy   PLAN FOR NEXT SESSION:  core and hip strengthening, single leg tasks, jumping, quick motion training, coordination of pelvic floor and breathing with tasks, knack with everything.    Eulis Foster, PT 03/17/22 10:15 AM

## 2022-03-24 ENCOUNTER — Encounter: Admitting: Physical Therapy

## 2022-03-29 ENCOUNTER — Ambulatory Visit: Admitting: Physical Therapy

## 2022-03-29 ENCOUNTER — Encounter: Payer: Self-pay | Admitting: Physical Therapy

## 2022-03-29 DIAGNOSIS — R293 Abnormal posture: Secondary | ICD-10-CM

## 2022-03-29 DIAGNOSIS — M6281 Muscle weakness (generalized): Secondary | ICD-10-CM | POA: Diagnosis not present

## 2022-03-29 DIAGNOSIS — R279 Unspecified lack of coordination: Secondary | ICD-10-CM

## 2022-03-29 NOTE — Therapy (Signed)
OUTPATIENT PHYSICAL THERAPY TREATMENT NOTE   Patient Name: Amber Hodge MRN: [id number] DOB:[birth year], 47 y.o., female Today's Date: 03/29/2022  PCP:  Thurnell Lose, MD  REFERRING PROVIDER: Ma Hillock, FNP   END OF SESSION:   PT End of Session - 03/29/22 1015     Visit Number 5    Date for PT Re-Evaluation 05/11/22    Authorization Type [employer]    PT Start Time 1015    PT Stop Time 1055    PT Time Calculation (min) 40 min    Activity Tolerance Patient tolerated treatment well    Behavior During Therapy [facility] for tasks assessed/performed             History reviewed. No pertinent past medical history. Past Surgical History:  Procedure Laterality Date   AUGMENTATION MAMMAPLASTY Bilateral    BREAST ENHANCEMENT SURGERY Bilateral 11/2006   WISDOM TOOTH EXTRACTION Bilateral 1999 or 2000   Patient Active Problem List   Diagnosis Date Noted   Right hip pain 05/27/2021   Myalgia 10/17/2017   Paresthesia 10/17/2017   REFERRING DIAG: N81.4 (ICD-10-CM) - Uterovaginal prolapse, unspecified N39.3 (ICD-10-CM) - Stress incontinence (female) (female)   THERAPY DIAG:  Muscle weakness (generalized)   Abnormal posture   Unspecified lack of coordination   Rationale for Evaluation and Treatment: Rehabilitation   ONSET DATE: 1997 with first baby   SUBJECTIVE:                                                                                                                                                                                            SUBJECTIVE STATEMENT: I can not wait 2 hours prior to going to the bathroom. I still use a pad.    PAIN:  Are you having pain? No     PRECAUTIONS: None   WEIGHT BEARING RESTRICTIONS: No   FALLS:  Has patient fallen in last 6 months? 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, retired Emergency planning/management officer    PLOF: Independent   PATIENT  GOALS: to have no leakage   PERTINENT HISTORY:  X3 vaginal births at least one with tearing, does have back pain sees chiropractor  Sexual abuse: No   BOWEL MOVEMENT: Pain with bowel movement: No Type of bowel movement:Type (Bristol Stool Scale) 4, Frequency daily, and Strain No Fully empty rectum: Yes:   Leakage: No Pads: No Fiber supplement: No   URINATION: Pain with urination: No Fully empty bladder: Yes:   Stream: Strong Urgency: No Frequency: "goes a lot but unsure how much", 1x at  night Leakage: Coughing, Sneezing, Exercise, and Lifting Pads: Yes: liners - 2/3x per day   INTERCOURSE: Pain with intercourse:  not painful Ability to have vaginal penetration:  Yes:   Climax: not painful Marinoff Scale: 0/3   PREGNANCY: Vaginal deliveries 3 Tearing Yes: first with biggest tear, no tearing with second, can't remember third  C-section deliveries 0 Currently pregnant No   PROLAPSE: None      OBJECTIVE:    DIAGNOSTIC FINDINGS:      COGNITION: Overall cognitive status: Within functional limits for tasks assessed                          SENSATION: Light touch: Appears intact Proprioception: Appears intact   MUSCLE LENGTH: Hamstrings and adductors limited by 25%   POSTURE: rounded shoulders and forward head   PELVIC ALIGNMENT: rt obliquity    LUMBARAROM/PROM:   A/PROM A/PROM  eval  Flexion WFL  Extension WFL  Right lateral flexion WFL  Left lateral flexion Limited by 25%  Right rotation Limited by 25%  Left rotation Limited by 25%   (Blank rows = not tested)   LOWER EXTREMITY ROM:   WFL   LOWER EXTREMITY MMT:   Bil hips grossly 4/5, knees and ankles 5/5 PALPATION:   General  no TTP, Rt abdominal fascial restrictions noted with tension felt per pt                  External Perineal Exam no TTP                             Internal Pelvic Floor no TTP   Patient confirms identification and approves PT to assess internal pelvic floor and  treatment Yes   PELVIC MMT:   MMT eval 03/17/22  Vaginal 3/5, 8s, 8 reps 4/5 with good hug of therapist finger  (Blank rows = not tested)         TONE: WFL   PROLAPSE: Grade one anterior laxity with cough, improved with coordination of pelvic floor contraction and cough but mild descent still noted.    TODAY'S TREATMENT:    03/29/22 Manual: Soft tissue mobilization: Scar tissue mobilization: Myofascial release: Spinal mobilization: Internal pelvic floor techniques: Dry needling: Neuromuscular re-education: Core retraining: Core facilitation: Form correction: Pelvic floor contraction training: Down training: Exercises: Stretches/mobility: Strengthening: Nustep for 5 minutes while assess patient  lateral squats x10 each holding 10# focusing on zip up, top foot on BOSU ball Stand on flat side of BOSU ball going slowly working on contraction 1/2 kneel pulling green band to leg forward and red band around outside knee I/2 kneel bilateral shoulder extension Therapeutic activities: Functional strengthening activities: Self-care:        03/17/2022 Manual: Scar tissue mobilization: Release along the perineal scar to increase mobility Myofascial release: Release of the urogenital diaphragm Internal pelvic floor techniques: No emotional/communication barriers or cognitive limitation. Patient is motivated to learn. Patient understands and agrees with treatment goals and plan. PT explains patient will be examined in standing, sitting, and lying down to see how their muscles and joints work. When they are ready, they will be asked to remove their underwear so PT can examine their perineum. The patient is also given the option of providing their own chaperone as one is not provided in our facility. The patient also has the right and is explained the right to defer or refuse any part  of the evaluation or treatment including the internal exam. With the patient's consent, PT will use one  gloved finger to gently assess the muscles of the pelvic floor, seeing how well it contracts and relaxes and if there is muscle symmetry. After, the patient will get dressed and PT and patient will discuss exam findings and plan of care. PT and patient discuss plan of care, schedule, attendance policy and HEP activities.  Going through the vaginal canal working on the posterior wall to improve tissue mobility Going through the vaginal canal with fascial release along the sides of the bladder and urethra with other hand on the lower abdominal area.                                                                                                                            DATE:   03/10/22  NMRE: all exercises cued for coordination of breathing mechanics, core activation and pelvic floor activation for improved pelvic floor strength and decreased leakage.  Bosu: body weight squats 2x10, lateral squats x10 each  Single leg Sit to stand from standard chair x10 each 2x10 bridges  Agility ladder: x4 forward quick steps, x4 each lateral quick steps, x2 180 jumps each way Bridges 2x10     PATIENT EDUCATION:  Education details: [id number] Person educated: Patient Education method: Explanation, Demonstration, Tactile cues, Verbal cues, and Handouts Education comprehension: verbalized understanding and returned demonstration   HOME EXERCISE PROGRAM: 03/29/22 Access Code: [email or web address] URL: [email or web address] Date: 03/29/2022 Prepared by: Earlie Counts  Exercises - - Half Kneeling Anti-Rotation Press With Shoulder Flexion and Anchored Resistance  - 1 x daily - 3 x weekly - 2 sets - 10 reps - Half-Kneeling Shoulder Extension with Resistance  - 1 x daily - 3 x weekly - 2 sets - 10 reps - Bridge with Shoulder Extension and Resistance  - 1 x daily - 3 x weekly - 1 sets - 10 reps - Quadruped Pelvic Floor Contraction with Opposite Arm and Leg Lift  - 1 x daily - 3 x weekly - 2 sets - 10 reps    ASSESSMENT:   CLINICAL IMPRESSION: Patient will sidebend in the left lumbar with bird dog so having patient contract the left obliques more. She is doing the exercises slowly to feel the pelvic floor contraction because if she goes fast she does not feel it. Patient still has wetness in the pads. She is not able to wait for 2 hours to urinate.  Pt would benefit from additional PT to further address deficits.     OBJECTIVE IMPAIRMENTS: decreased coordination, decreased endurance, decreased strength, increased fascial restrictions, impaired flexibility, improper body mechanics, and postural dysfunction.    ACTIVITY LIMITATIONS: carrying, lifting, squatting, continence, and locomotion level   PARTICIPATION LIMITATIONS: community activity   PERSONAL FACTORS: Time since onset of injury/illness/exacerbation and 1 comorbidity: x3 vaginal births with tearing for at least first  are also affecting patient's functional outcome.    REHAB  POTENTIAL: Good   CLINICAL DECISION MAKING: Stable/uncomplicated   EVALUATION COMPLEXITY: Low     GOALS: Goals reviewed with patient? Yes   SHORT TERM GOALS: Target date: 03/09/22   Pt to be I with HEP.  Baseline: Goal status: 03/17/22   2.  Pt to report improved time between bladder voids to at least 2 hours for improved QOL with decreased urinary frequency.   Baseline: urinate 1-1.5 hours  Goal status: ongoing 03/17/2022   3.  Pt to report no more than one instance of leakage with working out on her own per session to improve tolerance to activity.  Baseline:  Goal status: Met 03/17/2022   4.  Pt to be I with coordination of pelvic floor and breathing mechanics with body weight squats without cues for improved pelvic stability and decreased leakage.  Baseline:  Goal status: ongoing 03/17/2022     LONG TERM GOALS: Target date: 05/11/22   Pt to be I with advanced HEP.  Baseline:  Goal status: ongoing 03/29/22   2.  Pt to be I with coordination of pelvic  floor and breathing mechanics with 20# squats without cues for improved pelvic stability and decreased leakage.  Baseline:  Goal status: INITIAL   3.  Pt to demonstrate at least 4/5 pelvic floor strength for improved pelvic stability and decreased strain at pelvic floor/ decrease leakage.  Baseline:  Goal status: INITIAL   4.  Pt to report improved time between bladder voids to at least 2.5 hours for improved QOL with decreased urinary frequency.   Baseline:  Goal status: INITIAL   5.  Pt will be able to functional actions such as forward and lateral jumping with minimal to no leakage for improved tolerance to increased challenge of exercises.  Baseline:  Goal status: INITIAL   PLAN:   PT FREQUENCY: 1x/week   PT DURATION:  8 sessions   PLANNED INTERVENTIONS: Therapeutic exercises, Therapeutic activity, Neuromuscular re-education, Patient/Family education, Self Care, Joint mobilization, Dry Needling, Electrical stimulation, Spinal mobilization, Cryotherapy, Moist heat, scar mobilization, Taping, Biofeedback, and Manual therapy   PLAN FOR NEXT SESSION:  core and hip strengthening, single leg tasks, jumping, quick motion training, urge to void, work toward 20 pound squat  Eulis Foster, PT 03/29/22 11:01 AM

## 2022-04-07 ENCOUNTER — Ambulatory Visit: Attending: Nurse Practitioner | Admitting: Physical Therapy

## 2022-04-07 ENCOUNTER — Encounter: Payer: Self-pay | Admitting: Physical Therapy

## 2022-04-07 DIAGNOSIS — R279 Unspecified lack of coordination: Secondary | ICD-10-CM | POA: Diagnosis present

## 2022-04-07 DIAGNOSIS — R293 Abnormal posture: Secondary | ICD-10-CM | POA: Diagnosis present

## 2022-04-07 DIAGNOSIS — M6281 Muscle weakness (generalized): Secondary | ICD-10-CM | POA: Insufficient documentation

## 2022-04-07 NOTE — Patient Instructions (Signed)

## 2022-04-07 NOTE — Therapy (Signed)
OUTPATIENT PHYSICAL THERAPY TREATMENT NOTE   Patient Name: Evynn Boutelle MRN: [id number] DOB:1976/02/14, 47 y.o., female Today's Date: 04/07/2022  PCP: Thurnell Lose, MD   REFERRING PROVIDER: Ma Hillock, FNP    END OF SESSION:   PT End of Session - 04/07/22 0933     Visit Number 6    Date for PT Re-Evaluation 05/11/22    Authorization Type [employer]    PT Start Time 0930    PT Stop Time 1010    PT Time Calculation (min) 40 min    Activity Tolerance Patient tolerated treatment well    Behavior During Therapy [facility] for tasks assessed/performed             History reviewed. No pertinent past medical history. Past Surgical History:  Procedure Laterality Date   AUGMENTATION MAMMAPLASTY Bilateral    BREAST ENHANCEMENT SURGERY Bilateral 11/2006   WISDOM TOOTH EXTRACTION Bilateral 1999 or 2000   Patient Active Problem List   Diagnosis Date Noted   Right hip pain 05/27/2021   Myalgia 10/17/2017   Paresthesia 10/17/2017   REFERRING DIAG: N81.4 (ICD-10-CM) - Uterovaginal prolapse, unspecified N39.3 (ICD-10-CM) - Stress incontinence (female) (female)   THERAPY DIAG:  Muscle weakness (generalized)   Abnormal posture   Unspecified lack of coordination   Rationale for Evaluation and Treatment: Rehabilitation   ONSET DATE: 1997 with first baby   SUBJECTIVE:                                                                                                                                                                                            SUBJECTIVE STATEMENT: Leakage is 30% better.     PAIN:  Are you having pain? No     PRECAUTIONS: None   WEIGHT BEARING RESTRICTIONS: No   FALLS:  Has patient fallen in last 6 months? 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, retired Emergency planning/management officer    PLOF: Independent   PATIENT GOALS: to have no leakage   PERTINENT HISTORY:   X3 vaginal births at least one with tearing, does have back pain sees chiropractor  Sexual abuse: No   BOWEL MOVEMENT: Pain with bowel movement: No Type of bowel movement:Type (Bristol Stool Scale) 4, Frequency daily, and Strain No Fully empty rectum: Yes:   Leakage: No Pads: No Fiber supplement: No   URINATION: Pain with urination: No Fully empty bladder: Yes:   Stream: Strong Urgency: No Frequency: "goes a lot but unsure how much", 1x at night Leakage: Coughing, Sneezing, Exercise, and Lifting Pads: Yes: liners -  2/3x per day   INTERCOURSE: Pain with intercourse:  not painful Ability to have vaginal penetration:  Yes:   Climax: not painful Marinoff Scale: 0/3   PREGNANCY: Vaginal deliveries 3 Tearing Yes: first with biggest tear, no tearing with second, can't remember third  C-section deliveries 0 Currently pregnant No   PROLAPSE: None      OBJECTIVE:    DIAGNOSTIC FINDINGS:      COGNITION: Overall cognitive status: Within functional limits for tasks assessed                          SENSATION: Light touch: Appears intact Proprioception: Appears intact   MUSCLE LENGTH: Hamstrings and adductors limited by 25%   POSTURE: rounded shoulders and forward head   PELVIC ALIGNMENT: rt obliquity    LUMBARAROM/PROM:   A/PROM A/PROM  eval  Flexion WFL  Extension WFL  Right lateral flexion WFL  Left lateral flexion Limited by 25%  Right rotation Limited by 25%  Left rotation Limited by 25%   (Blank rows = not tested)   LOWER EXTREMITY ROM:   WFL   LOWER EXTREMITY MMT:   Bil hips grossly 4/5, knees and ankles 5/5 PALPATION:   General  no TTP, Rt abdominal fascial restrictions noted with tension felt per pt                  External Perineal Exam no TTP                             Internal Pelvic Floor no TTP   Patient confirms identification and approves PT to assess internal pelvic floor and treatment Yes   PELVIC MMT:   MMT eval 03/17/22   Vaginal 3/5, 8s, 8 reps 4/5 with good hug of therapist finger  (Blank rows = not tested)         TONE: WFL   PROLAPSE: Grade one anterior laxity with cough, improved with coordination of pelvic floor contraction and cough but mild descent still noted.    TODAY'S TREATMENT:    04/07/22 Manual: Soft tissue mobilization: To assess for dry needling Manual work to bilateral gluteal to elongate after dry needling Trigger Point Dry-Needling  Treatment instructions: Expect mild to moderate muscle soreness. S/S of pneumothorax if dry needled over a lung field, and to seek immediate medical attention should they occur. Patient verbalized understanding of these instructions and education.  Patient Consent Given: Yes Education handout provided: Yes Muscles treated: bilateral gluteus maximus and gluteus medius Electrical stimulation performed: No Parameters: N/A Treatment response/outcome: elongation of muscle and trigger point respons   Exercises: Stretches/mobility: Pigeon pose bil. 30 sec Ardine Eng pose hold 30 sec Strengthening: Recumbent bike for 5 minutes while assessing patient Quadruped lift opposite extremity with tactile cues to keep spinal neutral and work where less pain Squatting with 15# with therapist having a strap around the right thigh pulling laterally to assist with femoral head movement Squatting with 15# with correct body mechanics 20x Quadruped rocking back and forth with strap around the right thigh to assist with femoral head movement    03/29/22 Strengthening: Nustep for 5 minutes while assess patient  lateral squats x10 each holding 10# focusing on zip up, top foot on BOSU ball Stand on flat side of BOSU ball going slowly working on contraction 1/2 kneel pulling green band to leg forward and red band around outside knee I/2 kneel bilateral  shoulder extension       03/17/2022 Manual: Scar tissue mobilization: Release along the perineal scar to increase  mobility Myofascial release: Release of the urogenital diaphragm Internal pelvic floor techniques: No emotional/communication barriers or cognitive limitation. Patient is motivated to learn. Patient understands and agrees with treatment goals and plan. PT explains patient will be examined in standing, sitting, and lying down to see how their muscles and joints work. When they are ready, they will be asked to remove their underwear so PT can examine their perineum. The patient is also given the option of providing their own chaperone as one is not provided in our facility. The patient also has the right and is explained the right to defer or refuse any part of the evaluation or treatment including the internal exam. With the patient's consent, PT will use one gloved finger to gently assess the muscles of the pelvic floor, seeing how well it contracts and relaxes and if there is muscle symmetry. After, the patient will get dressed and PT and patient will discuss exam findings and plan of care. PT and patient discuss plan of care, schedule, attendance policy and HEP activities.  Going through the vaginal canal working on the posterior wall to improve tissue mobility Going through the vaginal canal with fascial release along the sides of the bladder and urethra with other hand on the lower abdominal area.                                                                                                                            DATE:   03/10/22  NMRE: all exercises cued for coordination of breathing mechanics, core activation and pelvic floor activation for improved pelvic floor strength and decreased leakage.  Bosu: body weight squats 2x10, lateral squats x10 each  Single leg Sit to stand from standard chair x10 each 2x10 bridges  Agility ladder: x4 forward quick steps, x4 each lateral quick steps, x2 180 jumps each way Bridges 2x10     PATIENT EDUCATION:  Education details: [id number] Person educated:  Patient Education method: Explanation, Demonstration, Tactile cues, Verbal cues, and Handouts Education comprehension: verbalized understanding and returned demonstration   HOME EXERCISE PROGRAM: 03/29/22 Access Code: [email or web address] URL: [email or web address] Date: 03/29/2022 Prepared by: Earlie Counts   Exercises - - Half Kneeling Anti-Rotation Press With Shoulder Flexion and Anchored Resistance  - 1 x daily - 3 x weekly - 2 sets - 10 reps - Half-Kneeling Shoulder Extension with Resistance  - 1 x daily - 3 x weekly - 2 sets - 10 reps - Bridge with Shoulder Extension and Resistance  - 1 x daily - 3 x weekly - 1 sets - 10 reps - Quadruped Pelvic Floor Contraction with Opposite Arm and Leg Lift  - 1 x daily - 3 x weekly - 2 sets - 10 reps   ASSESSMENT:   CLINICAL IMPRESSION: Patient had no anterior hip pain with squatting after manual work. She was able  to feel the pelvic floor contraction with squatting better after the right hip pain was gone. Patient reports her urinary leakage is 30% better. She has weakness in the gluteus medius especially on the right due to seeing the muscle quiver with hip extension in quadruped.  Pt would benefit from additional PT to further address deficits.     OBJECTIVE IMPAIRMENTS: decreased coordination, decreased endurance, decreased strength, increased fascial restrictions, impaired flexibility, improper body mechanics, and postural dysfunction.    ACTIVITY LIMITATIONS: carrying, lifting, squatting, continence, and locomotion level   PARTICIPATION LIMITATIONS: community activity   PERSONAL FACTORS: Time since onset of injury/illness/exacerbation and 1 comorbidity: x3 vaginal births with tearing for at least first  are also affecting patient's functional outcome.    REHAB POTENTIAL: Good   CLINICAL DECISION MAKING: Stable/uncomplicated   EVALUATION COMPLEXITY: Low     GOALS: Goals reviewed with patient? Yes   SHORT TERM GOALS: Target date:  03/09/22   Pt to be I with HEP.  Baseline: Goal status: Met 03/17/22   2.  Pt to report improved time between bladder voids to at least 2 hours for improved QOL with decreased urinary frequency.   Baseline: urinate 1-1.5 hours  Goal status: Met 04/07/22  3.  Pt to report no more than one instance of leakage with working out on her own per session to improve tolerance to activity.  Baseline:  Goal status: Met 03/17/2022   4.  Pt to be I with coordination of pelvic floor and breathing mechanics with body weight squats without cues for improved pelvic stability and decreased leakage.  Baseline:  Goal status: Met  04/07/2022     LONG TERM GOALS: Target date: 05/11/22   Pt to be I with advanced HEP.  Baseline:  Goal status: ongoing 03/29/22   2.  Pt to be I with coordination of pelvic floor and breathing mechanics with 20# squats without cues for improved pelvic stability and decreased leakage.  Baseline:  Goal status: INITIAL   3.  Pt to demonstrate at least 4/5 pelvic floor strength for improved pelvic stability and decreased strain at pelvic floor/ decrease leakage.  Baseline:  Goal status: INITIAL   4.  Pt to report improved time between bladder voids to at least 2.5 hours for improved QOL with decreased urinary frequency.   Baseline:  Goal status: INITIAL   5.  Pt will be able to functional actions such as forward and lateral jumping with minimal to no leakage for improved tolerance to increased challenge of exercises.  Baseline:  Goal status: INITIAL   PLAN:   PT FREQUENCY: 1x/week   PT DURATION:  8 sessions   PLANNED INTERVENTIONS: Therapeutic exercises, Therapeutic activity, Neuromuscular re-education, Patient/Family education, Self Care, Joint mobilization, Dry Needling, Electrical stimulation, Spinal mobilization, Cryotherapy, Moist heat, scar mobilization, Taping, Biofeedback, and Manual therapy   PLAN FOR NEXT SESSION:  core and hip strengthening, single leg tasks,  jumping, quick motion training, urge to void, work toward 20 pound squat Squat 2 legs and 1 leg with theraband to work gluteus medius  Eulis Foster, PT 04/07/22 10:17 AM

## 2022-04-12 ENCOUNTER — Encounter: Payer: Self-pay | Admitting: Physical Therapy

## 2022-04-12 ENCOUNTER — Ambulatory Visit: Admitting: Physical Therapy

## 2022-04-12 DIAGNOSIS — M6281 Muscle weakness (generalized): Secondary | ICD-10-CM

## 2022-04-12 DIAGNOSIS — R293 Abnormal posture: Secondary | ICD-10-CM

## 2022-04-12 DIAGNOSIS — R279 Unspecified lack of coordination: Secondary | ICD-10-CM

## 2022-04-12 NOTE — Therapy (Signed)
OUTPATIENT PHYSICAL THERAPY TREATMENT NOTE   Patient Name: Amber Hodge MRN: [id number] DOB:09/06/1975, 47 y.o., female Today's Date: 04/12/2022  PCP: Geryl Rankins, MD  REFERRING PROVIDER: Lavonna Monarch, FNP     END OF SESSION:   PT End of Session - 04/12/22 1104     Visit Number 7    Date for PT Re-Evaluation 05/11/22    Authorization Type [employer]    PT Start Time 1100    PT Stop Time 1140    PT Time Calculation (min) 40 min    Activity Tolerance Patient tolerated treatment well    Behavior During Therapy [facility] for tasks assessed/performed             History reviewed. No pertinent past medical history. Past Surgical History:  Procedure Laterality Date   AUGMENTATION MAMMAPLASTY Bilateral    BREAST ENHANCEMENT SURGERY Bilateral 11/2006   WISDOM TOOTH EXTRACTION Bilateral 1999 or 2000   Patient Active Problem List   Diagnosis Date Noted   Right hip pain 05/27/2021   Myalgia 10/17/2017   Paresthesia 10/17/2017   REFERRING DIAG: N81.4 (ICD-10-CM) - Uterovaginal prolapse, unspecified N39.3 (ICD-10-CM) - Stress incontinence (female) (female)   THERAPY DIAG:  Muscle weakness (generalized)   Abnormal posture   Unspecified lack of coordination   Rationale for Evaluation and Treatment: Rehabilitation   ONSET DATE: 1997 with first baby   SUBJECTIVE:                                                                                                                                                                                            SUBJECTIVE STATEMENT: Leakage is 30% better.  My hip feels a little better.    PAIN:  Are you having pain? No     PRECAUTIONS: None   WEIGHT BEARING RESTRICTIONS: No   FALLS:  Has patient fallen in last 6 months? 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, retired Producer, television/film/video    PLOF: Independent   PATIENT GOALS: to have no  leakage   PERTINENT HISTORY:  X3 vaginal births at least one with tearing, does have back pain sees chiropractor  Sexual abuse: No   BOWEL MOVEMENT: Pain with bowel movement: No Type of bowel movement:Type (Bristol Stool Scale) 4, Frequency daily, and Strain No Fully empty rectum: Yes:   Leakage: No Pads: No Fiber supplement: No   URINATION: Pain with urination: No Fully empty bladder: Yes:   Stream: Strong Urgency: No Frequency: , 1x at night Leakage: Coughing, Sneezing, Exercise, and Lifting Pads: Yes: liners -  2/3x per day   INTERCOURSE: Pain with intercourse:  not painful Ability to have vaginal penetration:  Yes:   Climax: not painful Marinoff Scale: 0/3   PREGNANCY: Vaginal deliveries 3 Tearing Yes: first with biggest tear, no tearing with second, can't remember third  C-section deliveries 0 Currently pregnant No   PROLAPSE: None      OBJECTIVE:    DIAGNOSTIC FINDINGS:      COGNITION: Overall cognitive status: Within functional limits for tasks assessed                          SENSATION: Light touch: Appears intact Proprioception: Appears intact   MUSCLE LENGTH: Hamstrings and adductors limited by 25%   POSTURE: rounded shoulders and forward head   PELVIC ALIGNMENT: rt obliquity    LUMBARAROM/PROM:   A/PROM A/PROM  eval  Flexion WFL  Extension WFL  Right lateral flexion WFL  Left lateral flexion Limited by 25%  Right rotation Limited by 25%  Left rotation Limited by 25%   (Blank rows = not tested)   LOWER EXTREMITY ROM:   WFL   LOWER EXTREMITY MMT:   Bil hips grossly 4/5, knees and ankles 5/5 PALPATION:   General  no TTP, Rt abdominal fascial restrictions noted with tension felt per pt                  External Perineal Exam no TTP                             Internal Pelvic Floor no TTP   Patient confirms identification and approves PT to assess internal pelvic floor and treatment Yes   PELVIC MMT:   MMT eval 03/17/22   Vaginal 3/5, 8s, 8 reps 4/5 with good hug of therapist finger  (Blank rows = not tested)         TONE: WFL   PROLAPSE: Grade one anterior laxity with cough, improved with coordination of pelvic floor contraction and cough but mild descent still noted.    TODAY'S TREATMENT:    04/12/22 Exercises:All exercises included abdominal and pelvic floor engagement Strengthening: Recumbent bike for 5 minutes while assessing patient Single leg squat with band around knee to increase ER and only going to the mat with 6 inch step Squat with 15# 10x then 20# 10x with tactile cues to the lower abdomen with no hip pain Bird dog with keeping spinal neutral  Dead bug holding 2 # wt. And yellow band around the knees Side lunge holding green band in door Lunge to stand with knee up pulling with green band Side plank with rotation holding 2#    04/07/22 Manual: Soft tissue mobilization: To assess for dry needling Manual work to bilateral gluteal to elongate after dry needling Trigger Point Dry-Needling  Treatment instructions: Expect mild to moderate muscle soreness. S/S of pneumothorax if dry needled over a lung field, and to seek immediate medical attention should they occur. Patient verbalized understanding of these instructions and education.   Patient Consent Given: Yes Education handout provided: Yes Muscles treated: bilateral gluteus maximus and gluteus medius Electrical stimulation performed: No Parameters: N/A Treatment response/outcome: elongation of muscle and trigger point respons  Exercises: Stretches/mobility: Pigeon pose bil. 30 sec Ardine Eng pose hold 30 sec Strengthening: Recumbent bike for 5 minutes while assessing patient Quadruped lift opposite extremity with tactile cues to keep spinal neutral and work where less pain Squatting  with 15# with therapist having a strap around the right thigh pulling laterally to assist with femoral head movement Squatting with 15# with correct  body mechanics 20x Quadruped rocking back and forth with strap around the right thigh to assist with femoral head movement    03/29/22 Strengthening: Nustep for 5 minutes while assess patient  lateral squats x10 each holding 10# focusing on zip up, top foot on BOSU ball Stand on flat side of BOSU ball going slowly working on contraction 1/2 kneel pulling green band to leg forward and red band around outside knee I/2 kneel bilateral shoulder extension       03/17/2022 Manual: Scar tissue mobilization: Release along the perineal scar to increase mobility Myofascial release: Release of the urogenital diaphragm Internal pelvic floor techniques: No emotional/communication barriers or cognitive limitation. Patient is motivated to learn. Patient understands and agrees with treatment goals and plan. PT explains patient will be examined in standing, sitting, and lying down to see how their muscles and joints work. When they are ready, they will be asked to remove their underwear so PT can examine their perineum. The patient is also given the option of providing their own chaperone as one is not provided in our facility. The patient also has the right and is explained the right to defer or refuse any part of the evaluation or treatment including the internal exam. With the patient's consent, PT will use one gloved finger to gently assess the muscles of the pelvic floor, seeing how well it contracts and relaxes and if there is muscle symmetry. After, the patient will get dressed and PT and patient will discuss exam findings and plan of care. PT and patient discuss plan of care, schedule, attendance policy and HEP activities.  Going through the vaginal canal working on the posterior wall to improve tissue mobility Going through the vaginal canal with fascial release along the sides of the bladder and urethra with other hand on the lower abdominal area.                                                                                                                             DATE:   03/10/22  NMRE: all exercises cued for coordination of breathing mechanics, core activation and pelvic floor activation for improved pelvic floor strength and decreased leakage.  Bosu: body weight squats 2x10, lateral squats x10 each  Single leg Sit to stand from standard chair x10 each 2x10 bridges  Agility ladder: x4 forward quick steps, x4 each lateral quick steps, x2 180 jumps each way Bridges 2x10     PATIENT EDUCATION:  04/12/22 Education details: [id number] Person educated: Patient Education method: Explanation, Demonstration, Tactile cues, Verbal cues, and Handouts Education comprehension: verbalized understanding and returned demonstration   HOME EXERCISE PROGRAM: 04/12/22 Access Code: [email or web address] URL: [email or web address] Date: 04/12/2022 Prepared by: Earlie Counts  Exercises - Seated Diaphragmatic Breathing  - 1 x daily - 7 x weekly - 1 sets -  10 reps - Seated Pelvic Floor Contraction  - 1 x daily - 7 x weekly - 1 sets - 10 reps - Seated Quick Flick Pelvic Floor Contractions  - 1 x daily - 7 x weekly - 1 sets - 10 reps - Seated Cough with Pelvic Floor Contraction and Hand to Mouth  - 1 x daily - 7 x weekly - 1 sets - 10 reps - Seated Pelvic Floor Muscles Isometrics on Towel Roll  - 1 x daily - 7 x weekly - 1 sets - 10 reps - 8-10s holds - Half Kneeling Anti-Rotation Press With Shoulder Flexion and Anchored Resistance  - 1 x daily - 3 x weekly - 2 sets - 10 reps - Half-Kneeling Shoulder Extension with Resistance  - 1 x daily - 3 x weekly - 2 sets - 10 reps - Bridge with Shoulder Extension and Resistance  - 1 x daily - 3 x weekly - 1 sets - 10 reps - Quadruped Pelvic Floor Contraction with Opposite Arm and Leg Lift  - 1 x daily - 3 x weekly - 2 sets - 10 reps - Single Leg Partial Squat with Resistance  - 1 x daily - 2 x weekly - 1 sets - 10 reps - Standing Side Lunge With Pelvic Floor  Contraction  - 1 x daily - 2 x weekly - 1 sets - 10 reps - Runner's Lunge  - 1 x daily - 2 x weekly - 3 sets - 10 reps - Supine Dead Bug with Leg Extension  - 1 x daily - 2 x weekly - 3 sets - 10 reps - Side Plank with Arm Lift  - 1 x daily - 2 x weekly - 3 sets - 10 reps   ASSESSMENT:   CLINICAL IMPRESSION: Patient had no anterior hip pain with squatting using 20# wt.. Patient reports her urinary leakage is 30% better. Patient has minimal leakage with her exercises and uses the correct breath to engage the core and pelvic floor. She sometimes needs tactile cues to engage the lower abdominal with her exercises. She has increased pelvic floor strength to 4/5.  Patient is not secure enough to not wear a pad yet.   OBJECTIVE IMPAIRMENTS: decreased coordination, decreased endurance, decreased strength, increased fascial restrictions, impaired flexibility, improper body mechanics, and postural dysfunction.    ACTIVITY LIMITATIONS: carrying, lifting, squatting, continence, and locomotion level   PARTICIPATION LIMITATIONS: community activity   PERSONAL FACTORS: Time since onset of injury/illness/exacerbation and 1 comorbidity: x3 vaginal births with tearing for at least first  are also affecting patient's functional outcome.    REHAB POTENTIAL: Good   CLINICAL DECISION MAKING: Stable/uncomplicated   EVALUATION COMPLEXITY: Low     GOALS: Goals reviewed with patient? Yes   SHORT TERM GOALS: Target date: 03/09/22   Pt to be I with HEP.  Baseline: Goal status: Met 03/17/22   2.  Pt to report improved time between bladder voids to at least 2 hours for improved QOL with decreased urinary frequency.   Baseline: urinate 1-1.5 hours  Goal status: Met 04/07/22  3.  Pt to report no more than one instance of leakage with working out on her own per session to improve tolerance to activity.  Baseline:  Goal status: Met 03/17/2022   4.  Pt to be I with coordination of pelvic floor and breathing mechanics  with body weight squats without cues for improved pelvic stability and decreased leakage.  Baseline:  Goal status: Met  04/07/2022  LONG TERM GOALS: Target date: 05/11/22   Pt to be I with advanced HEP.  Baseline:  Goal status: Met 04/12/22   2.  Pt to be I with coordination of pelvic floor and breathing mechanics with 20# squats without cues for improved pelvic stability and decreased leakage.  Baseline:  Goal status: Met 04/12/22   3.  Pt to demonstrate at least 4/5 pelvic floor strength for improved pelvic stability and decreased strain at pelvic floor/ decrease leakage.  Baseline:  Goal status: Met 04/12/22   4.  Pt to report improved time between bladder voids to at least 2.5 hours for improved QOL with decreased urinary frequency.   Baseline:  Goal status: Met 04/12/22   5.  Pt will be able to functional actions such as forward and lateral jumping with minimal to no leakage for improved tolerance to increased challenge of exercises.  Baseline:  Goal status: Met 04/12/22   PLAN:  PLAN   Discharge to HEP    Eulis Foster, PT 04/12/22 11:05 AM   PHYSICAL THERAPY DISCHARGE SUMMARY  Visits from Start of Care: 7  Current functional level related to goals / functional outcomes: See above.    Remaining deficits: See above.    Education / Equipment: HEP   Patient agrees to discharge. Patient goals were partially met. Patient is being discharged due to being pleased with the current functional level. Thank you for the referral. Eulis Foster, PT 04/12/22 11:21 AM

## 2022-10-15 IMAGING — CR DG LUMBAR SPINE COMPLETE 4+V
5 series · 5 of 5 positions shown · non-contrast
Comparison: None.

CLINICAL DATA: LOW BACK AND RT HIP PAIN-ANTERIOR

EXAM:
LUMBAR SPINE - COMPLETE 4+ VIEW

[t l-spine a.p.]
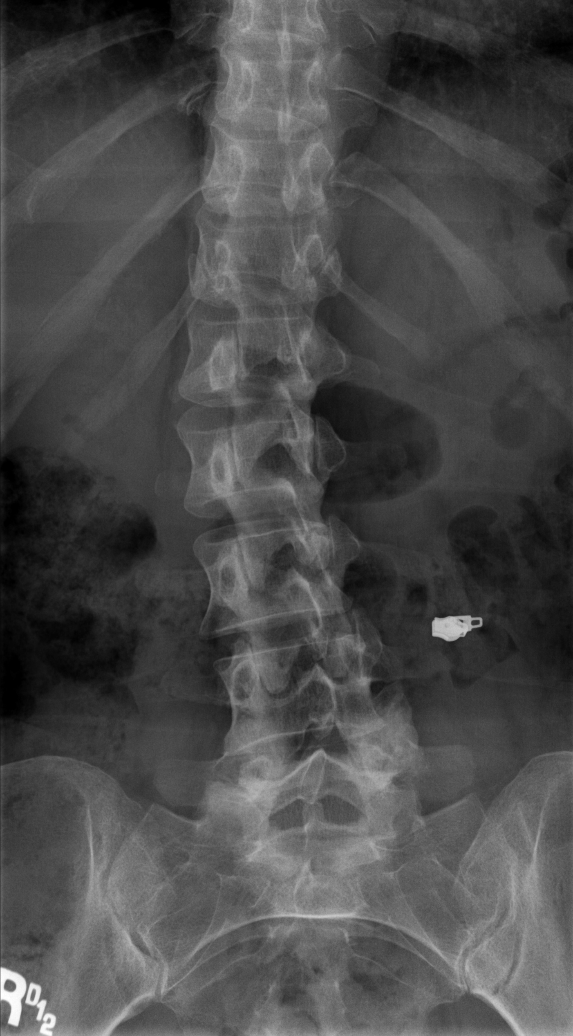

[t l-spine oblique exposure (1 of 2)]
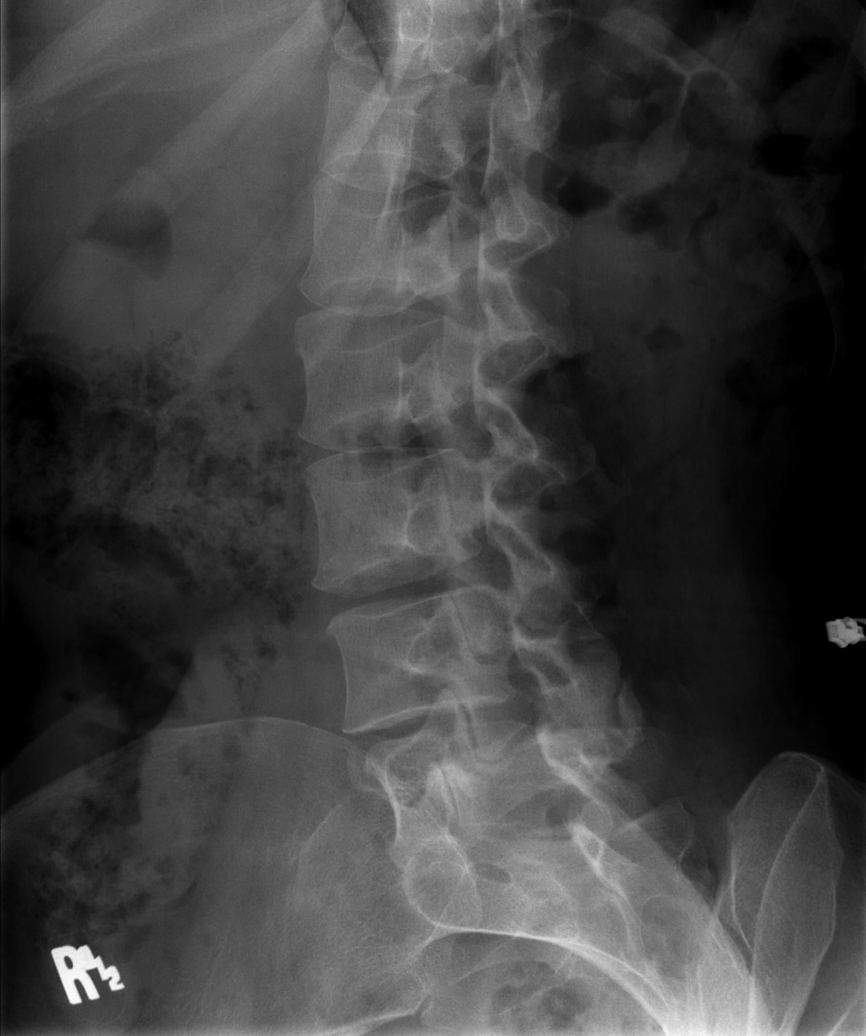

[t l-spine oblique exposure (2 of 2)]
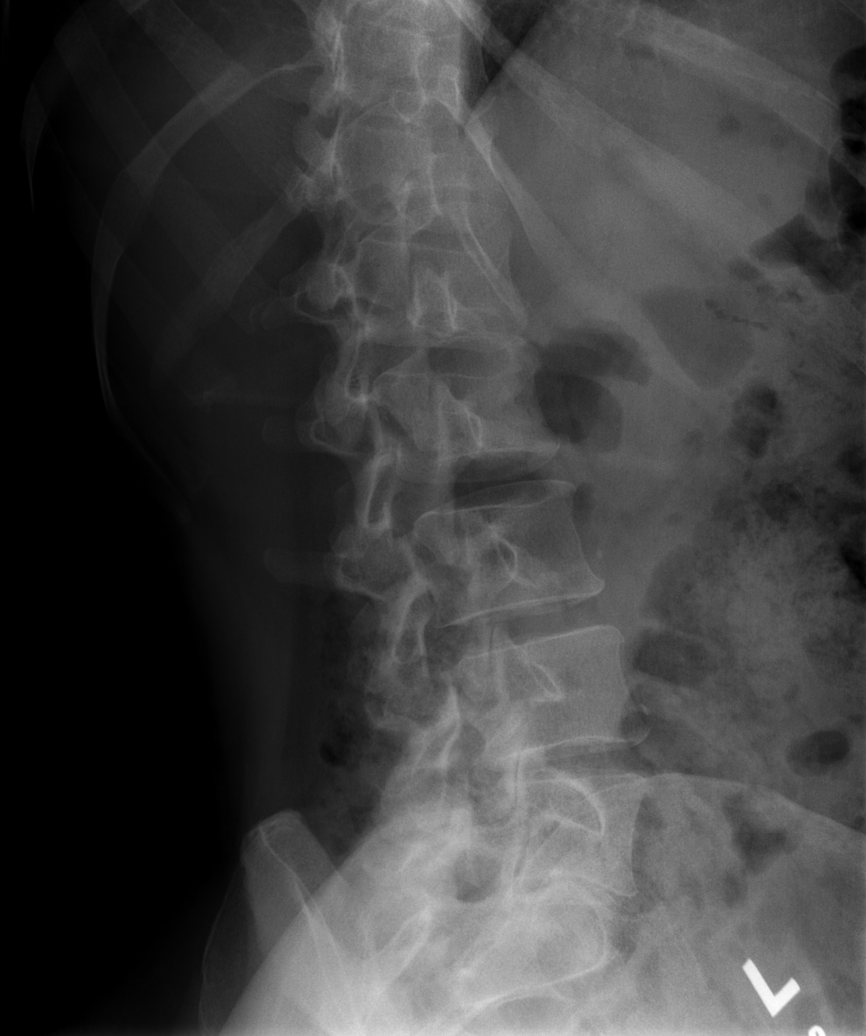

[t l-spine lat]
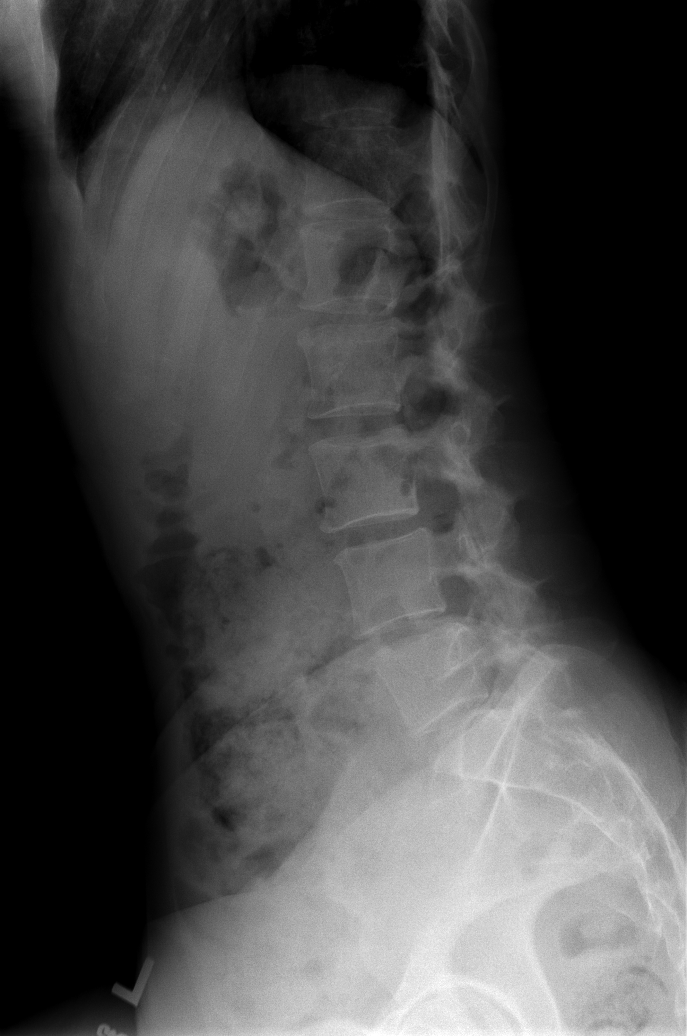

[t l-spine l5-s1 spot]
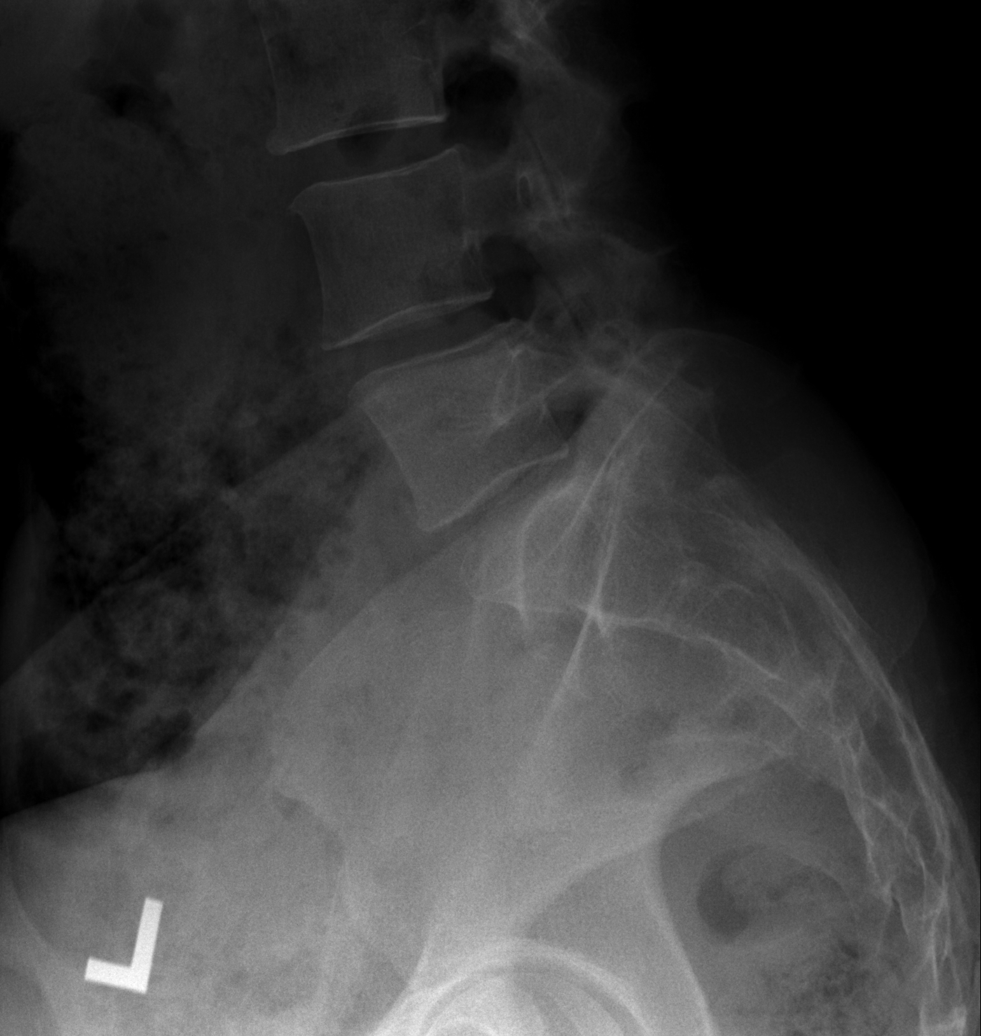

[5 of 5 positions shown; findings below may reference images not displayed]

FINDINGS: There is no evidence of lumbar spine fracture. Dextrocurvature of
the lumbar spine centered at the L2 level. Otherwise alignment is
normal. Intervertebral disc spaces are maintained.
IMPRESSION: No acute displaced fracture or traumatic listhesis of the lumbar
spine.

## 2023-07-31 ENCOUNTER — Other Ambulatory Visit: Payer: Self-pay | Admitting: Orthopedic Surgery

## 2023-07-31 ENCOUNTER — Other Ambulatory Visit: Payer: Self-pay | Admitting: Interventional Radiology

## 2023-07-31 DIAGNOSIS — S43439A Superior glenoid labrum lesion of unspecified shoulder, initial encounter: Secondary | ICD-10-CM

## 2023-07-31 DIAGNOSIS — S43431S Superior glenoid labrum lesion of right shoulder, sequela: Secondary | ICD-10-CM

## 2023-08-07 ENCOUNTER — Ambulatory Visit
Admission: RE | Admit: 2023-08-07 | Discharge: 2023-08-07 | Disposition: A | Discharge status: Home / Self Care | Source: Ambulatory Visit | Attending: Interventional Radiology | Admitting: Interventional Radiology

## 2023-08-07 DIAGNOSIS — S43431S Superior glenoid labrum lesion of right shoulder, sequela: Secondary | ICD-10-CM

## 2023-08-07 HISTORY — PX: IR RADIOLOGIST EVAL & MGMT: IMG5224

## 2023-08-07 NOTE — Consult Note (Signed)
 Chief Complaint: Patient was seen in consultation today for right hip pain at the request of Yukari Flax K  Referring Physician(s): Shawntina Diffee K  History of Present Illness: Amber Hodge is a 48 y.o. 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  She has had a history of nagging right hip pain for some time and has engaged in conservative measures to improve and reduce her pain including strength and stretching exercises.  She recently had an MRI which demonstrates a superior labral tear.  There is no evidence of arthritis or other abnormality.  She has seen Dr. Liliane Rei in consultation to discuss operative repair.  She has considered this but understands that the recovery process can be long and arduous.  She also spoke to her chiropractor who suggested she consider getting a steroid injection.  Further, rather than doing a steroid injection blindly, he recommended she seek consultation with an interventional radiologist to undergo image guided joint injection.  She is in good health and very pleasant and engaging.  She had some concerns about the type of contrast used for localization.  She is not interested in receiving gadolinium at this time.  I assured her that we use standard iodinated contrast.  Also, we discussed the odds of her developing a dependence on the steroid which I assured her was extremely low.    Past Surgical History:  Procedure Laterality Date   AUGMENTATION MAMMAPLASTY Bilateral    BREAST ENHANCEMENT SURGERY Bilateral 11/2006   IR RADIOLOGIST EVAL & MGMT  08/07/2023   WISDOM TOOTH EXTRACTION Bilateral 1999 or 2000    Allergies: Codeine  Medications: Prior to Admission medications   Medication Sig Start Date End Date Taking? Authorizing Provider  b complex vitamins capsule Take 1 capsule by mouth daily.   Yes [provider]  CRANBERRY EXTRACT PO Take  100 mg by mouth.   Yes [provider]  ibuprofen (ADVIL,MOTRIN) 200 MG tablet Take 600 mg by mouth every 6 (six) hours as needed for cramping.   Yes [provider]  magnesium gluconate (MAGONATE) 500 MG tablet Take 500 mg by mouth daily.   Yes [provider]  Multiple Vitamin (MULTIVITAMIN WITH MINERALS) TABS tablet Take 1 tablet by mouth daily.   Yes [provider]  aspirin-acetaminophen-caffeine (EXCEDRIN MIGRAINE) 250-250-65 MG tablet Take 1 tablet by mouth every 6 (six) hours as needed for headache. Patient not taking: Reported on 02/09/2022    [provider]  LYSINE PO Take 1,000 mg by mouth daily. L-Lysine Patient not taking: Reported on 08/07/2023    [provider]  omeprazole (PRILOSEC) 20 MG capsule Take 20 mg by mouth 2 (two) times daily. Patient not taking: Reported on 08/07/2023 02/19/19   [provider]     Family History  Problem Relation Age of Onset   Heart murmur Mother    Healthy Father    Heart attack Maternal Grandfather    Hyperlipidemia Paternal Grandfather    Diabetes Paternal Grandfather     Social History   Socioeconomic History   Marital status: Married    Spouse name: Not on file   Number of children: 2   Years of education: trade school   Highest education level: Not on file  Occupational History   Occupation: hairdresser  Tobacco Use   Smoking status: Former    Current packs/day: 0.00    Types: Cigarettes  Quit date: 03/11/2000    Years since quitting: 23.4   Smokeless tobacco: Never   Tobacco comments:    Socially, while drinking   Vaping Use   Vaping status: Never Used  Substance and Sexual Activity   Alcohol use: Yes    Alcohol/week: 4.0 standard drinks of alcohol    Types: 4 Standard drinks or equivalent per week    Comment: Socially    Drug use: No   Sexual activity: Yes  Other Topics Concern   Not on file  Social History Narrative   Lives at home with husband and  children.   Right-handed.   1 cup coffee per day.   Social Drivers of Corporate investment banker Strain: Not on file  Food Insecurity: Not on file  Transportation Needs: Not on file  Physical Activity: Not on file  Stress: Not on file  Social Connections: Not on file    Review of Systems: A 12 point ROS discussed and pertinent positives are indicated in the HPI above.  All other systems are negative.  Review of Systems  Vital Signs: BP 99/64 (BP Location: Left Arm, Patient Position: Sitting, Cuff Size: Normal)   Pulse 71   Temp 98.3 F (36.8 C) (Oral)   Resp 16   SpO2 97%     Physical Exam Constitutional:      General: She is not in acute distress.    Appearance: Normal appearance.  HENT:     Head: Normocephalic and atraumatic.  Eyes:     General: No scleral icterus. Cardiovascular:     Rate and Rhythm: Normal rate.  Pulmonary:     Effort: Pulmonary effort is normal.  Abdominal:     General: There is no distension.     Tenderness: There is no guarding.  Skin:    General: Skin is warm and dry.  Neurological:     Mental Status: She is alert and oriented to person, place, and time.  Psychiatric:        Mood and Affect: Mood normal.        Behavior: Behavior normal.       Imaging: IR Radiologist Eval & Mgmt Result Date: 08/07/2023 EXAM: NEW PATIENT OFFICE VISIT CHIEF COMPLAINT: SEE NOTE IN EPIC HISTORY OF PRESENT ILLNESS: SEE NOTE IN EPIC REVIEW OF SYSTEMS: SEE NOTE IN EPIC PHYSICAL EXAMINATION: SEE NOTE IN EPIC ASSESSMENT AND PLAN: SEE NOTE IN EPIC Electronically Signed   By: Fernando Hoyer M.D.   On: 08/07/2023 14:27    Labs:  CBC: No results for input(s): "WBC", "HGB", "HCT", "PLT" in the last 8760 hours.  COAGS: No results for input(s): "INR", "APTT" in the last 8760 hours.  BMP: No results for input(s): "NA", "K", "CL", "CO2", "GLUCOSE", "BUN", "CALCIUM", "CREATININE", "GFRNONAA", "GFRAA" in the last 8760 hours.  Invalid input(s):  "CMP"  LIVER FUNCTION TESTS: No results for input(s): "BILITOT", "AST", "ALT", "ALKPHOS", "PROT", "ALBUMIN" in the last 8760 hours.  TUMOR MARKERS: No results for input(s): "AFPTM", "CEA", "CA199", "CHROMGRNA" in the last 8760 hours.  Assessment and Plan:  Very pleasant 48 year old female with a history of right labral tear and chronic right hip pain.  We discussed intra-articular injection of corticosteroid with imaging guidance.  I explained the procedure in detail, describing how the procedure is done and what she could expect.  She also told me that she has discussed the possibility of a labral surgery at length with Dr. France Ina.  At this time, she is not yet ready  to pursue full surgical repair but would like to try an image guided intra-articular steroid injection.  1.)  Please schedule for right hip injection to be performed by me at [facility].  Thank you for this interesting consult.  I greatly enjoyed meeting Amber Hodge and look forward to participating in their care.  A copy of this report was sent to the requesting provider on this date.  Electronically Signed: Roxie Cord 08/07/2023, 2:35 PM   I spent a total of  30 Minutes  in face to face in clinical consultation, greater than 50% of which was counseling/coordinating care for right hip pain.

## 2023-08-10 ENCOUNTER — Other Ambulatory Visit: Payer: Self-pay | Admitting: Orthopedic Surgery

## 2023-08-10 DIAGNOSIS — M25551 Pain in right hip: Secondary | ICD-10-CM

## 2023-09-12 ENCOUNTER — Ambulatory Visit
Admission: RE | Admit: 2023-09-12 | Discharge: 2023-09-12 | Disposition: A | Discharge status: Home / Self Care | Source: Ambulatory Visit | Attending: Orthopedic Surgery | Admitting: Orthopedic Surgery

## 2023-09-12 DIAGNOSIS — M25551 Pain in right hip: Secondary | ICD-10-CM

## 2023-09-12 MED ORDER — IOPAMIDOL (ISOVUE-M 200) INJECTION 41%
1.0000 mL | Freq: Once | INTRAMUSCULAR | Status: AC
  Administered 2023-09-12: 1 mL via INTRA_ARTICULAR

## 2023-09-12 MED ORDER — METHYLPREDNISOLONE ACETATE 40 MG/ML INJ SUSP (RADIOLOG
80.0000 mg | Freq: Once | INTRAMUSCULAR | Status: AC
  Administered 2023-09-12: 80 mg via INTRA_ARTICULAR
